# Patient Record
Sex: Female | Born: 1945
Health system: Southern US, Community
[De-identification: ages and names within clinical notes are randomized; demographics above are authoritative.]

## PROBLEM LIST (undated history)

## (undated) DIAGNOSIS — R51 Headache: Secondary | ICD-10-CM

## (undated) DIAGNOSIS — F419 Anxiety disorder, unspecified: Secondary | ICD-10-CM

## (undated) DIAGNOSIS — J302 Other seasonal allergic rhinitis: Secondary | ICD-10-CM

## (undated) DIAGNOSIS — R0989 Other specified symptoms and signs involving the circulatory and respiratory systems: Secondary | ICD-10-CM

## (undated) DIAGNOSIS — F32A Depression, unspecified: Secondary | ICD-10-CM

## (undated) DIAGNOSIS — R413 Other amnesia: Secondary | ICD-10-CM

## (undated) DIAGNOSIS — R404 Transient alteration of awareness: Secondary | ICD-10-CM

## (undated) DIAGNOSIS — F329 Major depressive disorder, single episode, unspecified: Secondary | ICD-10-CM

## (undated) HISTORY — DX: Transient alteration of awareness: R40.4

## (undated) HISTORY — DX: Major depressive disorder, single episode, unspecified: F32.9

## (undated) HISTORY — PX: TUBAL LIGATION: SHX77

## (undated) HISTORY — DX: Other amnesia: R41.3

## (undated) HISTORY — DX: Other specified symptoms and signs involving the circulatory and respiratory systems: R09.89

## (undated) HISTORY — DX: Headache: R51

## (undated) HISTORY — DX: Anxiety disorder, unspecified: F41.9

## (undated) HISTORY — DX: Depression, unspecified: F32.A

## (undated) HISTORY — DX: Other seasonal allergic rhinitis: J30.2

---

## 2004-05-14 ENCOUNTER — Other Ambulatory Visit: Admission: RE | Admit: 2004-05-14 | Discharge: 2004-05-14 | Payer: Self-pay | Admitting: Family Medicine

## 2007-12-23 ENCOUNTER — Other Ambulatory Visit: Admission: RE | Admit: 2007-12-23 | Discharge: 2007-12-23 | Payer: Self-pay | Admitting: Family Medicine

## 2012-08-05 ENCOUNTER — Encounter: Payer: Self-pay | Admitting: Neurology

## 2012-08-05 ENCOUNTER — Ambulatory Visit (INDEPENDENT_AMBULATORY_CARE_PROVIDER_SITE_OTHER): Payer: Medicare Other | Admitting: Neurology

## 2012-08-05 VITALS — BP 134/86 | HR 73 | Ht 67.0 in | Wt 204.0 lb

## 2012-08-05 DIAGNOSIS — R51 Headache: Secondary | ICD-10-CM

## 2012-08-05 DIAGNOSIS — R404 Transient alteration of awareness: Secondary | ICD-10-CM | POA: Insufficient documentation

## 2012-08-05 DIAGNOSIS — R519 Headache, unspecified: Secondary | ICD-10-CM | POA: Insufficient documentation

## 2012-08-05 MED ORDER — TIZANIDINE HCL 2 MG PO CAPS: 2.0000 mg | ORAL_CAPSULE | Freq: Three times a day (TID) | ORAL | Status: DC

## 2012-08-05 NOTE — Progress Notes (Signed)
Reason for visit: Altered awareness  Mariah Stevenson is a 67 y.o. female  History of present illness:  Mariah Stevenson is a 67 year old right-handed white female with a history of depression. The patient has been on Prozac for this. The patient was seen through this office in February 2013 for a tremor that occurred while on lithium. The patient had been on Seroquel previously. The patient indicates an approximately 18 month history of a pressure sensations throughout the head that feels as if there is a balloon inside the headache expanding. The pressure sensation is present everyday, with some days being worse than others. The patient denies any significant neck stiffness with the pressure. The patient indicates that often times the pressure sensations are worse in the morning, better as the day goes on. The patient has a sensation of stuffiness in the sinuses, but treatment with antibiotics have not helped. The patient also reports episodes that began about 6-8 months prior to this evaluation. The patient will have episodes of altered awareness. The patient feels as if she might black out or lose control of her body. The patient denies any palpitations of the heart, and she may have some tingling sensations around the mouth. The patient otherwise has not noted any numbness around the arms or legs. The patient has not actually had a blackout. The patient also has other episodes of unfamiliar feelings. The patient will be driving and suddenly does not recognize her surroundings. The episodes of the loss of control may last about 5 minutes, and the patient believes that the episodes have occurred around 20 times within the last one year. The patient indicates that the feelings of unfamiliar surroundings and the episodes of losing control are separate from one another. The patient gives a history of at least 2 seizures as a child, with the last episode occurring at 67 years of age. The patient was  treated with phenobarbital for approximately one year, and she has not been on any anticonvulsant medications since that time. The patient is sent to this office for an evaluation.  Past Medical History  Diagnosis Date  . Headache   . Seasonal allergies   . Depression   . Sinus complaint   . Altered awareness, transient     History reviewed. No pertinent past surgical history.  Family History  Problem Relation Age of Onset  . Stroke Father     Social history:  reports that she quit smoking about 19 years ago. She does not have any smokeless tobacco history on file. She reports that  drinks alcohol. She reports that she does not use illicit drugs.  Medications:  No current outpatient prescriptions on file prior to visit.   No current facility-administered medications on file prior to visit.    Allergies:  Allergies  Allergen Reactions  . Penicillins Rash    thrast in throat  . Sulfur     seizures    ROS:  Out of a complete 14 system review of symptoms, the patient complains only of the following symptoms, and all other reviewed systems are negative.  Fatigue Ringing in the ears Cough Feeling cold Memory loss, headache, numbness  Blood pressure 134/86, pulse 73, height 5\' 7"  (1.702 m), weight 204 lb (92.534 kg).  Physical Exam  General: The patient is alert and cooperative at the time of the examination.  Head: Pupils are equal, round, and reactive to light. Discs are flat bilaterally.  Neck: The neck is supple, no carotid bruits are  noted.  Respiratory: The respiratory examination is clear.  Cardiovascular: The cardiovascular examination reveals a regular rate and rhythm, no obvious murmurs or rubs are noted.  Skin: Extremities are without significant edema.  Neurologic Exam  Mental status:  Cranial nerves: Facial symmetry is present. There is good sensation of the face to pinprick and soft touch bilaterally. The strength of the facial muscles and the  muscles to head turning and shoulder shrug are normal bilaterally. Speech is well enunciated, no aphasia or dysarthria is noted. Extraocular movements are full. Visual fields are full.  Motor: The motor testing reveals 5 over 5 strength of all 4 extremities. Good symmetric motor tone is noted throughout.  Sensory: Sensory testing is intact to pinprick, soft touch, vibration sensation, and position sense on all 4 extremities. No evidence of extinction is noted.  Coordination: Cerebellar testing reveals good finger-nose-finger and heel-to-shin bilaterally.  Gait and station: Gait is normal. Tandem gait is slightly unsteady. Romberg is negative. No drift is seen.  Reflexes: Deep tendon reflexes are symmetric and normal bilaterally. Toes are downgoing bilaterally.   Assessment/Plan:  1. Muscle tension headache  2. Episodes of altered awareness   3. History of seizures as a child  The episodes of altered awareness and feeling of unfamiliar surroundings could potentially represent seizure events. The patient is having a daily pressure sensations in the head that likely represent muscle tension headache. The patient will be given a trial on tizanidine for the headaches, and she will undergo a seizure workup. The patient will be sent for MRI evaluation of the brain with and without gadolinium. The patient will have an EEG study. The patient will followup in 3 months. An empiric trial on antiepileptic medications in the future may be indicated.  Marlan Palau MD 08/05/2012 7:34 PM  [facility] [address] [place], [place] [address]  Phone [phone] Fax [phone]

## 2012-08-07 ENCOUNTER — Ambulatory Visit (INDEPENDENT_AMBULATORY_CARE_PROVIDER_SITE_OTHER): Payer: Medicare Other

## 2012-08-07 ENCOUNTER — Telehealth: Payer: Self-pay | Admitting: Neurology

## 2012-08-07 DIAGNOSIS — R404 Transient alteration of awareness: Secondary | ICD-10-CM

## 2012-08-07 DIAGNOSIS — R51 Headache: Secondary | ICD-10-CM

## 2012-08-07 NOTE — Telephone Encounter (Signed)
I called the patient. The EEG study is unremarkable. I'll call her mind at the results of the MRI the brain. If the episodes continue, an empiric trial on seizure medications may still be indicated.

## 2012-08-07 NOTE — Procedures (Signed)
  History:  Mariah Stevenson is a 67 year old patient with a history of episodes of feeling as if she is losing control of her body. The patient may have some numbness and tingling sensations around the mouth. The patient is being evaluated for these episodes.   This is a routine EEG. No skull defects are noted. Medications include calcium, iron supplementation, Prozac, multivitamins, Prilosec, and tizanidine.   EEG classification: Essentially normal awake  Description of the recording: The background rhythms of this recording consists of a fairly well modulated low amplitude alpha rhythm of 10 Hz that is reactive to eye opening and closure. As the record progresses, the patient appears to remain in the waking state throughout the recording. Photic stimulation was performed, resulting in a bilateral and symmetric photic driving response. Hyperventilation was also performed, resulting in a minimal buildup of the background rhythm activities without significant slowing seen. At no time during the recording does there appear to be evidence of spike or spike wave discharges or evidence of focal slowing. EKG monitor shows no evidence of cardiac rhythm abnormalities with a heart rate of 66.  Impression: This is an essentially normal EEG recording in the waking state. No evidence of ictal or interictal discharges are seen.

## 2012-08-14 ENCOUNTER — Ambulatory Visit
Admission: RE | Admit: 2012-08-14 | Discharge: 2012-08-14 | Disposition: A | Discharge status: Home / Self Care | Payer: Medicare Other | Source: Ambulatory Visit | Attending: Neurology | Admitting: Neurology

## 2012-08-14 DIAGNOSIS — R51 Headache: Secondary | ICD-10-CM

## 2012-08-14 DIAGNOSIS — R404 Transient alteration of awareness: Secondary | ICD-10-CM

## 2012-08-14 MED ORDER — GADOBENATE DIMEGLUMINE 529 MG/ML IV SOLN
19.0000 mL | Freq: Once | INTRAVENOUS | Status: AC | PRN
  Administered 2012-08-14: 19 mL via INTRAVENOUS

## 2012-08-17 ENCOUNTER — Telehealth: Payer: Self-pay | Admitting: Neurology

## 2012-08-17 NOTE — Telephone Encounter (Signed)
I called the patient. The MRI of the brain looked OK, some mild SVD. The patient will call if the episodes of feeling of unfamiliarity worsens, and we will try topamax.

## 2012-10-13 ENCOUNTER — Other Ambulatory Visit: Payer: Self-pay

## 2012-10-13 MED ORDER — TIZANIDINE HCL 2 MG PO CAPS: 2.0000 mg | ORAL_CAPSULE | Freq: Three times a day (TID) | ORAL | Status: DC

## 2012-12-10 ENCOUNTER — Ambulatory Visit (INDEPENDENT_AMBULATORY_CARE_PROVIDER_SITE_OTHER): Payer: Medicare Other | Admitting: Neurology

## 2012-12-10 ENCOUNTER — Encounter: Payer: Self-pay | Admitting: Neurology

## 2012-12-10 VITALS — BP 127/71 | HR 66 | Ht 67.5 in | Wt 203.0 lb

## 2012-12-10 DIAGNOSIS — R51 Headache: Secondary | ICD-10-CM

## 2012-12-10 DIAGNOSIS — R404 Transient alteration of awareness: Secondary | ICD-10-CM

## 2012-12-10 DIAGNOSIS — D518 Other vitamin B12 deficiency anemias: Secondary | ICD-10-CM

## 2012-12-10 DIAGNOSIS — R413 Other amnesia: Secondary | ICD-10-CM

## 2012-12-10 DIAGNOSIS — R6889 Other general symptoms and signs: Secondary | ICD-10-CM

## 2012-12-10 HISTORY — DX: Other amnesia: R41.3

## 2012-12-10 MED ORDER — TIZANIDINE HCL 2 MG PO CAPS: ORAL_CAPSULE | ORAL | Status: DC

## 2012-12-10 NOTE — Progress Notes (Signed)
Reason for visit: Tension headache  Mariah Stevenson is an 67 y.o. female  History of present illness:  Mariah Stevenson is a 67 year old right-handed white female with a history of depression and tension headaches. The patient indicates that the tizanidine has helped some, but she never went up to 3 of the 2 mg tablets daily. The patient takes one tablet twice daily. The patient indicates that she is having issues with fatigue. The patient claims that she likes to sleep on her side, but she has some hip arthritis, and she now has to sleep flat on her back. The patient does not know whether she snores or not at night, but she feels fatigued as if she never slept when she gets up in the morning. The patient has not had any further events of confusion or loss of awareness. The EEG study was normal. MRI evaluation of the brain shows mild small vessel disease. The patient reports that she has some issues with her memory that she believes may be lifelong where some events in the distant past that occurred with herself or her children that she does not recall. The patient returns to this office for an evaluation.  Past Medical History  Diagnosis Date  . Headache(784.0)   . Seasonal allergies   . Depression   . Sinus complaint   . Altered awareness, transient   . Memory deficits 12/10/2012    Past Surgical History  Procedure Laterality Date  . Tubal ligation      Family History  Problem Relation Age of Onset  . Stroke Father     Social history:  reports that she quit smoking about 19 years ago. She has never used smokeless tobacco. She reports that  drinks alcohol. She reports that she does not use illicit drugs.    Allergies  Allergen Reactions  . Sulfur Other (See Comments)    seizures  . Penicillins Rash    thrush in throat    Medications:  Current Outpatient Prescriptions on File Prior to Visit  Medication Sig Dispense Refill  . Calcium Carbonate-Vitamin D (CALCIUM + D PO)  Take by mouth. One tablet daily      . ferrous sulfate 325 (65 FE) MG tablet Take 325 mg by mouth daily with breakfast.      . FLUoxetine HCl (PROZAC PO) Take 60 mg by mouth. Three tablets daily      . Multiple Vitamin (MULTIVITAMIN) capsule Take 1 capsule by mouth daily.      Marland Kitchen omeprazole (PRILOSEC) 20 MG capsule Take 20 mg by mouth daily.       No current facility-administered medications on file prior to visit.    ROS:  Out of a complete 14 system review of symptoms, the patient complains only of the following symptoms, and all other reviewed systems are negative.  Ringing in the ears Joint pain Achy muscles Memory, headache, numbness, confusion, difficulty swallowing Depression, anxiety, decreased energy, and disinterest in activities, change in appetite Sleepiness  Blood pressure 127/71, pulse 66, height 5' 7.5" (1.715 m), weight 203 lb (92.08 kg).  Physical Exam  General: The patient is alert and cooperative at the time of the examination. The patient is minimally obese.  Skin: No significant peripheral edema is noted.   Neurologic Exam  Cranial nerves: Facial symmetry is present. Speech is normal, no aphasia or dysarthria is noted. Extraocular movements are full. Visual fields are full.  Motor: The patient has good strength in all 4 extremities.  Coordination:  The patient has good finger-nose-finger and heel-to-shin bilaterally.  Gait and station: The patient has a normal gait. Tandem gait is normal. Romberg is negative. No drift is seen.  Reflexes: Deep tendon reflexes are symmetric.   Assessment/Plan:  1. Tension headache  2. Episodes of altered awareness  3. Reported memory disorder  The patient continues to have her tension headaches. The patient indicates that she is having increased fatigue, and she has been forced recently to sleep on her back secondary to hip discomfort. The patient could be developing obstructive sleep apnea because of this, and this  may be increasing her fatigue, and her problems with decreased concentration. We will check blood work today. If the fatigue issues continuing to worsen, a sleep study may be indicated. The patient indicates that she is not having episodes of loss of awareness at this point. If this recurs, impaired therapy with Topamax may be indicated. The patient will followup in 6 months.  Marlan Palau MD 12/10/2012 5:28 PM  [facility] [address] [place], [place] [address]  Phone [phone] Fax [phone]

## 2012-12-11 LAB — TSH: TSH: 1.84 u[IU]/mL (ref 0.450–4.500)

## 2012-12-11 LAB — RPR: RPR: NONREACTIVE

## 2012-12-11 LAB — VITAMIN B12: Vitamin B-12: 1283 pg/mL — ABNORMAL HIGH (ref 211–946)

## 2012-12-11 NOTE — Progress Notes (Signed)
Quick Note:  Spoke to patient and relayed blood work results as unremarkable, per Dr. Willis. ______ 

## 2013-04-30 ENCOUNTER — Encounter: Payer: Self-pay | Admitting: Neurology

## 2013-06-21 ENCOUNTER — Ambulatory Visit (INDEPENDENT_AMBULATORY_CARE_PROVIDER_SITE_OTHER): Payer: Medicare Other | Admitting: Neurology

## 2013-06-21 ENCOUNTER — Encounter: Payer: Self-pay | Admitting: Neurology

## 2013-06-21 ENCOUNTER — Encounter (INDEPENDENT_AMBULATORY_CARE_PROVIDER_SITE_OTHER): Payer: Self-pay

## 2013-06-21 VITALS — Wt 202.0 lb

## 2013-06-21 DIAGNOSIS — R413 Other amnesia: Secondary | ICD-10-CM

## 2013-06-21 DIAGNOSIS — R51 Headache: Secondary | ICD-10-CM

## 2013-06-21 NOTE — Progress Notes (Signed)
Reason for visit: Headache  Mariah Stevenson is an 68 y.o. female  History of present illness:  Mariah Stevenson is a 68 year old right-handed white female with a history of headaches that were felt secondary to tension headaches. The patient has been placed on tizanidine, but she did not note any particular benefit with the medication. The patient has had an improvement with the headaches, possibly following an increase in the dose of the Prozac to 60 mg daily. The patient is having 2 or 3 headaches a week, and [facility]. The patient is able to function through the headache, and the headaches are not disabling. More recently, the patient has noted some issues with intermittent numbness of the hands, usually when she is sitting and putting pressure on the hands. The patient does not note any problems with waking up at night with the numbness. The patient indicates that she has not had any further episodes of confusion. The patient in general feels that she is doing much better. The patient has come off of the tizanidine.  Past Medical History  Diagnosis Date  . Headache(784.0)   . Seasonal allergies   . Depression   . Sinus complaint   . Altered awareness, transient   . Memory deficits 12/10/2012    Past Surgical History  Procedure Laterality Date  . Tubal ligation      Family History  Problem Relation Age of Onset  . Stroke Father     Social history:  reports that she quit smoking about 20 years ago. She has never used smokeless tobacco. She reports that she drinks alcohol. She reports that she does not use illicit drugs.    Allergies  Allergen Reactions  . Sulfur Other (See Comments)    seizures  . Penicillins Rash    thrush in throat    Medications:  Current Outpatient Prescriptions on File Prior to Visit  Medication Sig Dispense Refill  . Biotin (PA BIOTIN) 1000 MCG tablet Take 1,000 mcg by mouth daily.      . Calcium  Carbonate-Vitamin D (CALCIUM + D PO) Take by mouth. One tablet daily      . Multiple Vitamin (MULTIVITAMIN) capsule Take 1 capsule by mouth daily.      Marland Kitchen. omeprazole (PRILOSEC) 20 MG capsule Take 20 mg by mouth daily.      . montelukast (SINGULAIR) 10 MG tablet Take 10 mg by mouth daily.       No current facility-administered medications on file prior to visit.    ROS:  Out of a complete 14 system review of symptoms, the patient complains only of the following symptoms, and all other reviewed systems are negative.  Ring in the ears, difficulty swallowing Cold intolerance Frequent waking, daytime sleepiness Joint pain Memory loss, numbness, depression  Weight 202 lb (91.627 kg).  Physical Exam  General: The patient is alert and cooperative at the time of the examination.  Neuromuscular: The patient has good range of movement the cervical spine.  Skin: No significant peripheral edema is noted.   Neurologic Exam  Mental status: The patient is oriented x 3.Mini-Mental status examination done today shows a total score of 30/30.  Cranial nerves: Facial symmetry is present. Speech is normal, no aphasia or dysarthria is noted. Extraocular movements are full. Visual fields are full.  Motor: The patient has good strength in all 4 extremities.The patient has good strength of the intrinsic muscles of the hands, Tinel sign is negative  at the wrists bilaterally.  Sensory examination:Soft touch sensation is symmetric on the face, arms, and legs.  Coordination: The patient has good finger-nose-finger and heel-to-shin bilaterally.  Gait and station: The patient has a normal gait. Tandem gait is slightly unsteady. Romberg is negative. No drift is seen.  Reflexes: Deep tendon reflexes are symmetric, but the ankle jerk reflexes are depressed bilaterally.   Assessment/Plan:  One. History of headache  2. Episodes of confusion  The patient has had resolution of the episodes of confusion.  The patient is doing better with her headaches, feeling better in general. The patient is taking BC powders if needed for headache. I have cautioned her about overusing this medication. The patient will contact our office if further problems arise, otherwise the patient will be seen as needed.  Marlan Palau MD 06/21/2013 7:57 PM  [facility] [address] [place], [place] [address]  Phone [phone] Fax [phone]

## 2013-06-21 NOTE — Patient Instructions (Signed)

## 2014-01-07 ENCOUNTER — Encounter: Payer: Self-pay | Admitting: *Deleted

## 2014-02-16 ENCOUNTER — Encounter: Payer: Self-pay | Admitting: Neurology

## 2014-02-22 ENCOUNTER — Encounter: Payer: Self-pay | Admitting: Neurology

## 2014-11-28 ENCOUNTER — Other Ambulatory Visit: Payer: Self-pay | Admitting: Pain Medicine

## 2014-11-28 DIAGNOSIS — R4182 Altered mental status, unspecified: Secondary | ICD-10-CM

## 2014-12-06 ENCOUNTER — Ambulatory Visit (INDEPENDENT_AMBULATORY_CARE_PROVIDER_SITE_OTHER): Payer: Medicare Other

## 2014-12-06 DIAGNOSIS — R4182 Altered mental status, unspecified: Secondary | ICD-10-CM | POA: Diagnosis not present

## 2014-12-07 ENCOUNTER — Ambulatory Visit: Payer: Self-pay | Admitting: Neurology

## 2014-12-09 ENCOUNTER — Other Ambulatory Visit: Payer: Self-pay | Admitting: Neurology

## 2014-12-09 DIAGNOSIS — R4182 Altered mental status, unspecified: Secondary | ICD-10-CM

## 2014-12-12 ENCOUNTER — Ambulatory Visit (INDEPENDENT_AMBULATORY_CARE_PROVIDER_SITE_OTHER): Payer: Medicare Other | Admitting: Neurology

## 2014-12-12 DIAGNOSIS — R4182 Altered mental status, unspecified: Secondary | ICD-10-CM | POA: Diagnosis not present

## 2014-12-14 NOTE — Procedures (Signed)
ELECTROENCEPHALOGRAM REPORT  Date of Study: 12/12/2014  Patient's Name: Mariah Stevenson MRN: [id number] Date of Birth: Dec 21, 1945  Referring Provider: Dr. Emmaline Life  Clinical History: This is a 69 year old woman with history of 1 seizure as an infant and another at age 63 who two years ago had "constant fogginess" which resolved and now has started again, is worse and with arm shaking. Having EEG to evaluate for seizures.  Medications: Prozac, Prilosec  Technical Summary: A multichannel digital EEG recording measured by the international 10-20 system with electrodes applied with paste and impedances below 5000 ohms performed in our laboratory with EKG monitoring in an awake and asleep patient.  Hyperventilation and photic stimulation were performed.  The digital EEG was referentially recorded, reformatted, and digitally filtered in a variety of bipolar and referential montages for optimal display.    Description: The patient is awake and asleep during the recording.  During maximal wakefulness, there is a poorly sustained low to medium voltage 9 Hz posterior dominant rhythm that poorly attenuates to eye opening and eye closure.  The record is symmetric.  During drowsiness and sleep, there is an increase in theta slowing of the background.  Vertex waves and symmetric sleep spindles were seen.  Hyperventilation and photic stimulation did not elicit any abnormalities.  There were no epileptiform discharges or electrographic seizures seen.    EKG lead was unremarkable.  Impression: This awake and asleep EEG is within normal limits.  Clinical Correlation: A normal EEG does not exclude a clinical diagnosis of epilepsy. Clinical correlation is advised.   Patrcia Dolly, M.D.

## 2015-05-05 DIAGNOSIS — R4182 Altered mental status, unspecified: Secondary | ICD-10-CM | POA: Diagnosis not present

## 2015-05-05 DIAGNOSIS — I6782 Cerebral ischemia: Secondary | ICD-10-CM | POA: Diagnosis not present

## 2015-06-02 DIAGNOSIS — J988 Other specified respiratory disorders: Secondary | ICD-10-CM | POA: Diagnosis not present

## 2015-06-02 DIAGNOSIS — J45909 Unspecified asthma, uncomplicated: Secondary | ICD-10-CM | POA: Diagnosis not present

## 2015-06-12 DIAGNOSIS — J209 Acute bronchitis, unspecified: Secondary | ICD-10-CM | POA: Diagnosis not present

## 2015-06-12 DIAGNOSIS — D473 Essential (hemorrhagic) thrombocythemia: Secondary | ICD-10-CM | POA: Diagnosis not present

## 2015-07-06 DIAGNOSIS — J329 Chronic sinusitis, unspecified: Secondary | ICD-10-CM | POA: Diagnosis not present

## 2015-07-17 DIAGNOSIS — J329 Chronic sinusitis, unspecified: Secondary | ICD-10-CM | POA: Diagnosis not present

## 2015-07-17 DIAGNOSIS — J9801 Acute bronchospasm: Secondary | ICD-10-CM | POA: Diagnosis not present

## 2015-07-17 DIAGNOSIS — R05 Cough: Secondary | ICD-10-CM | POA: Diagnosis not present

## 2015-07-19 ENCOUNTER — Other Ambulatory Visit: Payer: Self-pay | Admitting: Family Medicine

## 2015-07-19 DIAGNOSIS — R911 Solitary pulmonary nodule: Secondary | ICD-10-CM

## 2015-07-25 ENCOUNTER — Ambulatory Visit (INDEPENDENT_AMBULATORY_CARE_PROVIDER_SITE_OTHER): Payer: Medicare Other

## 2015-07-25 DIAGNOSIS — I2510 Atherosclerotic heart disease of native coronary artery without angina pectoris: Secondary | ICD-10-CM | POA: Diagnosis not present

## 2015-07-25 DIAGNOSIS — I7090 Unspecified atherosclerosis: Secondary | ICD-10-CM

## 2015-07-25 DIAGNOSIS — R918 Other nonspecific abnormal finding of lung field: Secondary | ICD-10-CM

## 2015-07-25 DIAGNOSIS — R911 Solitary pulmonary nodule: Secondary | ICD-10-CM

## 2015-07-28 DIAGNOSIS — R0789 Other chest pain: Secondary | ICD-10-CM | POA: Diagnosis not present

## 2015-07-28 DIAGNOSIS — R05 Cough: Secondary | ICD-10-CM | POA: Diagnosis not present

## 2015-07-28 DIAGNOSIS — R911 Solitary pulmonary nodule: Secondary | ICD-10-CM | POA: Diagnosis not present

## 2015-08-16 DIAGNOSIS — L0390 Cellulitis, unspecified: Secondary | ICD-10-CM | POA: Diagnosis not present

## 2015-08-16 DIAGNOSIS — T148 Other injury of unspecified body region: Secondary | ICD-10-CM | POA: Diagnosis not present

## 2015-08-16 DIAGNOSIS — W108XXA Fall (on) (from) other stairs and steps, initial encounter: Secondary | ICD-10-CM | POA: Diagnosis not present

## 2015-09-15 DIAGNOSIS — H25813 Combined forms of age-related cataract, bilateral: Secondary | ICD-10-CM | POA: Diagnosis not present

## 2015-09-15 DIAGNOSIS — H52223 Regular astigmatism, bilateral: Secondary | ICD-10-CM | POA: Diagnosis not present

## 2015-09-15 DIAGNOSIS — H524 Presbyopia: Secondary | ICD-10-CM | POA: Diagnosis not present

## 2015-09-15 DIAGNOSIS — H40053 Ocular hypertension, bilateral: Secondary | ICD-10-CM | POA: Diagnosis not present

## 2015-09-15 DIAGNOSIS — H5203 Hypermetropia, bilateral: Secondary | ICD-10-CM | POA: Diagnosis not present

## 2015-09-22 DIAGNOSIS — R05 Cough: Secondary | ICD-10-CM | POA: Diagnosis not present

## 2015-09-22 DIAGNOSIS — R938 Abnormal findings on diagnostic imaging of other specified body structures: Secondary | ICD-10-CM | POA: Diagnosis not present

## 2015-09-25 DIAGNOSIS — Z1231 Encounter for screening mammogram for malignant neoplasm of breast: Secondary | ICD-10-CM | POA: Diagnosis not present

## 2015-10-05 ENCOUNTER — Ambulatory Visit (INDEPENDENT_AMBULATORY_CARE_PROVIDER_SITE_OTHER)
Admission: RE | Admit: 2015-10-05 | Discharge: 2015-10-05 | Disposition: A | Discharge status: Home / Self Care | Payer: Medicare Other | Source: Ambulatory Visit | Attending: Internal Medicine | Admitting: Internal Medicine

## 2015-10-05 ENCOUNTER — Ambulatory Visit (INDEPENDENT_AMBULATORY_CARE_PROVIDER_SITE_OTHER): Payer: Medicare Other | Admitting: Internal Medicine

## 2015-10-05 ENCOUNTER — Other Ambulatory Visit (INDEPENDENT_AMBULATORY_CARE_PROVIDER_SITE_OTHER): Payer: Medicare Other

## 2015-10-05 ENCOUNTER — Encounter: Payer: Self-pay | Admitting: Internal Medicine

## 2015-10-05 VITALS — BP 126/80 | HR 74 | Ht 66.0 in | Wt 190.6 lb

## 2015-10-05 DIAGNOSIS — R05 Cough: Secondary | ICD-10-CM | POA: Diagnosis not present

## 2015-10-05 DIAGNOSIS — R0600 Dyspnea, unspecified: Secondary | ICD-10-CM

## 2015-10-05 DIAGNOSIS — J45991 Cough variant asthma: Secondary | ICD-10-CM

## 2015-10-05 DIAGNOSIS — R918 Other nonspecific abnormal finding of lung field: Secondary | ICD-10-CM

## 2015-10-05 LAB — CBC WITH DIFFERENTIAL/PLATELET
BASOS PCT: 0.4 % (ref 0.0–3.0)
Basophils Absolute: 0 10*3/uL (ref 0.0–0.1)
EOS ABS: 0.1 10*3/uL (ref 0.0–0.7)
Eosinophils Relative: 0.7 % (ref 0.0–5.0)
HCT: 34.3 % — ABNORMAL LOW (ref 36.0–46.0)
HEMOGLOBIN: 11.6 g/dL — AB (ref 12.0–15.0)
LYMPHS ABS: 2.1 10*3/uL (ref 0.7–4.0)
Lymphocytes Relative: 21.2 % (ref 12.0–46.0)
MCHC: 33.8 g/dL (ref 30.0–36.0)
MCV: 86.3 fl (ref 78.0–100.0)
MONO ABS: 0.6 10*3/uL (ref 0.1–1.0)
Monocytes Relative: 6.4 % (ref 3.0–12.0)
NEUTROS PCT: 71.3 % (ref 43.0–77.0)
Neutro Abs: 6.9 10*3/uL (ref 1.4–7.7)
PLATELETS: 328 10*3/uL (ref 150.0–400.0)
RBC: 3.97 Mil/uL (ref 3.87–5.11)
RDW: 14.6 % (ref 11.5–15.5)
WBC: 9.7 10*3/uL (ref 4.0–10.5)

## 2015-10-05 LAB — BASIC METABOLIC PANEL
BUN: 10 mg/dL (ref 6–23)
CALCIUM: 9.5 mg/dL (ref 8.4–10.5)
CO2: 27 mEq/L (ref 19–32)
Chloride: 99 mEq/L (ref 96–112)
Creatinine, Ser: 0.7 mg/dL (ref 0.40–1.20)
GFR: 87.85 mL/min (ref >60.00)
Glucose, Bld: 100 mg/dL — ABNORMAL HIGH (ref 70–99)
POTASSIUM: 4 meq/L (ref 3.5–5.1)
SODIUM: 135 meq/L (ref 135–145)

## 2015-10-05 LAB — TSH: TSH: 1.68 u[IU]/mL (ref 0.35–4.50)

## 2015-10-05 LAB — SEDIMENTATION RATE: Sed Rate: 45 mm/hr — ABNORMAL HIGH (ref 0–30)

## 2015-10-05 LAB — BRAIN NATRIURETIC PEPTIDE: PRO B NATRI PEPTIDE: 91 pg/mL (ref 0.0–100.0)

## 2015-10-05 MED ORDER — BUDESONIDE-FORMOTEROL FUMARATE 80-4.5 MCG/ACT IN AERO: INHALATION_SPRAY | RESPIRATORY_TRACT | Status: DC

## 2015-10-05 NOTE — Patient Instructions (Addendum)
Try symbicort 80 Take 2 puffs first thing in am and then another 2 puffs about 12 hours later.   Only use your albuterol as a rescue medication to be used if you can't catch your breath by resting or doing a relaxed purse lip breathing pattern.  - The less you use it, the better it will work when you need it. - Ok to use up to 2 puffs  every 4 hours if you must but call for immediate appointment if use goes up over your usual need - Don't leave home without it !!  (think of it like the spare tire for your car)   Work on inhaler technique:  relax and gently blow all the way out then take a nice smooth deep breath back in, triggering the inhaler at same time you start breathing in.  Hold for up to 5 seconds if you can. Blow out thru nose. Rinse and gargle with water when done  Change prilosec to Take 30-60 min before first meal of the day   GERD (REFLUX)  is an extremely common cause of respiratory symptoms just like yours , many times with no obvious heartburn at all.    It can be treated with medication, but also with lifestyle changes including elevation of the head of your bed (ideally with 6 inch  bed blocks),  Smoking cessation, avoidance of late meals, excessive alcohol, and avoid fatty foods, chocolate, peppermint, colas, red wine, and acidic juices such as orange juice.  NO MINT OR MENTHOL PRODUCTS SO NO COUGH DROPS  USE SUGARLESS CANDY INSTEAD (Jolley ranchers or Stover's or Life Savers) or even ice chips will also do - the key is to swallow to prevent all throat clearing. NO OIL BASED VITAMINS - use powdered substitutes.      Please remember to go to the lab and x-ray department downstairs for your tests - we will call you with the results when they are available.   Please schedule a follow up office visit in 2 weeks, sooner if needed

## 2015-10-05 NOTE — Assessment & Plan Note (Addendum)
10/05/2015  After extensive coaching HFA effectiveness =  75%   > try symb 80 2bid  - Spirometry 10/05/2015  FEV1 2.00 (81%)  Ratio 88 p am symb 160   So this is not copd and cough better p prednisone suggests an eosinophilic syndrome either asthma or eos bronchitis but should do fine either way on lower dose ICS since cough nor wheeze a major concern  She also is on rx for gerd but not taking ppi consistently ac daily > advised might contribute to asthma or uacs> diet also reviewed   Total time devoted to counseling  = 35/1664m review case with pt/ discussion of options/alternatives/ personally creating written instructions  in presence of pt  then going over those specific  Instructions directly with the pt including how to use all of the meds but in particular covering each new medication in detail and the difference between the maintenance/automatic meds and the prns using an action plan format for the latter.

## 2015-10-05 NOTE — Progress Notes (Signed)
Subjective:     Patient ID: Mariah Stevenson, female   DOB: 1945-08-10,     MRN: [id number]  HPI   [age] yowf quit smoking in 1995 with a tendency then to recurrent  bronchitis which resolved completely and did fine until winter of 2016 and since then recurrent pattern of episodic cough esp  since winter 2017 but  even between bouts did ok until off maint rx until late spring/early summer 2017    then   needed maint for the first time = symbicort 160 2bid and referred to pulmonary clinic 10/05/2015 by Dr Izola PriceMyers for f/u of abn CT chest from 07/25/15 with a nl baseline cxr at that point   10/05/2015 1st [facility] office visit/ Annalyce Lanpher   Chief Complaint  Patient presents with  . Pulmonary Consult    Referred by Dr Carlyon ShadowSteven Myers for eval of cough and pulmonary nodules. She c/o cough off and on since Jan 2017. Her cough has been better here recently, and is occ prod with clear sputum.    2 rounds of prednisone this year and did feel better and last used it mid May 2017  New doe = MMRC1 = can walk nl pace, flat grade, can't hurry or go uphills or steps s sob   Mucus clear/ cough since last round of prednisone but throat feels dry/ tight all the time but not while sleeping   No obvious day to day or daytime variability or assoc   cp or   subjective wheeze or overt sinus or hb symptoms. No unusual exp hx or h/o childhood pna/ asthma or knowledge of premature birth.  Sleeping ok without nocturnal  or early am exacerbation  of respiratory  c/o's or need for noct saba. Also denies any obvious fluctuation of symptoms with weather or environmental changes or other aggravating or alleviating factors except as outlined above   Current Medications, Allergies, Complete Past Medical History, Past Surgical History, Family History, and Social History were reviewed in [facility] electronic medical record.  ROS  The following are not active complaints unless bolded sore throat, dysphagia, dental problems,  itching, sneezing,  nasal congestion or excess/ purulent secretions, ear ache,   fever, chills, sweats, unintended wt loss, classically pleuritic or exertional cp, hemoptysis,  orthopnea pnd or leg swelling, presyncope, palpitations, abdominal pain, anorexia, nausea, vomiting, diarrhea  or change in bowel or bladder habits, change in stools or urine, dysuria,hematuria,  rash, arthralgias, visual complaints, headache, numbness, weakness or ataxia or problems with walking or coordination,  change in mood/affect or memory.        Review of Systems     Objective:   Physical Exam     slt hoarse amb wf   Wt Readings from Last 3 Encounters:  10/05/15 190 lb 9.6 oz (86.456 kg)  12/06/14 202 lb (91.627 kg)  06/21/13 202 lb (91.627 kg)    Vital signs reviewed    HEENT: nl dentition, turbinates, and oropharynx. Nl external ear canals without cough reflex   NECK :  without JVD/Nodes/TM/ nl carotid upstrokes bilaterally   LUNGS: no acc muscle use,  Nl contour chest which is clear to A and P bilaterally without cough on insp or exp maneuvers   CV:  RRR  no s3 or murmur or increase in P2, no edema   ABD:  soft and nontender with nl inspiratory excursion in the supine position. No bruits or organomegaly, bowel sounds nl  MS:  Nl gait/ ext warm without  deformities, calf tenderness, cyanosis or clubbing No obvious joint restrictions   SKIN: warm and dry without lesions    NEURO:  alert, approp, nl sensorium with  no motor deficits       I personally reviewed images and agree with radiology impression as follows:  CT Chest   07/25/15 Multiple bilateral sub solid ground-glass nodules, largest in the left upper lobe. Considerations include infectious/inflammatory etiologies and adenocarcinoma.      CXR PA and Lateral:   10/05/2015 :    I personally reviewed images and agree with radiology impression as follows:   Nodules in the left lung which are new or more conspicuous than on the  previous study. Given the findings on the previous chest CT scan and the fact that it has been 2 1/2 months since the previous study 07/17/15    Labs ordered/ reviewed:      Chemistry      Component Value Date/Time   NA 135 10/05/2015 1423   K 4.0 10/05/2015 1423   CL 99 10/05/2015 1423   CO2 27 10/05/2015 1423   BUN 10 10/05/2015 1423   CREATININE 0.70 10/05/2015 1423      Component Value Date/Time   CALCIUM 9.5 10/05/2015 1423        Lab Results  Component Value Date   WBC 9.7 10/05/2015   HGB 11.6* 10/05/2015   HCT 34.3* 10/05/2015   MCV 86.3 10/05/2015   PLT 328.0 10/05/2015       Lab Results  Component Value Date   TSH 1.68 10/05/2015     Lab Results  Component Value Date   PROBNP 91.0 10/05/2015       Lab Results  Component Value Date   ESRSEDRATE 45* 10/05/2015          Assessment:

## 2015-10-05 NOTE — Assessment & Plan Note (Signed)
CT chest 07/25/15 c/w bilateral GG changes s appearance on cxr - ESR 10/05/2015  = 45 with conspicuous nodules on cxr   ddx includes inflammatory (BOOP) and infection (MAI) and met dz at this point so needs f/u CT then likely PET and consideration for bx vs possible short term higher dose steroid rechallenge depending on the results of the above.

## 2015-10-05 NOTE — Assessment & Plan Note (Signed)
10/05/2015  Walked RA x 3 laps @ 185 ft each stopped due to  End of study, fast  pace, no sob desat to 87% at end of study.  Clearly has evidence of significant ex desat probably from underlying ILD  s evidence of copd by pfts > for now no change in rx but f/u in 2 weeks with HRCT unless d dimer is Pos in which case CTa chest will be needed

## 2015-10-06 ENCOUNTER — Other Ambulatory Visit: Payer: Self-pay | Admitting: Internal Medicine

## 2015-10-06 DIAGNOSIS — H25812 Combined forms of age-related cataract, left eye: Secondary | ICD-10-CM | POA: Diagnosis not present

## 2015-10-06 DIAGNOSIS — R911 Solitary pulmonary nodule: Secondary | ICD-10-CM

## 2015-10-06 LAB — RESPIRATORY ALLERGY PROFILE REGION II ~~LOC~~
Allergen, D pternoyssinus,d7: <0.1 kU/L
Allergen, Mulberry, t76: <0.1 kU/L
Allergen, Oak,t7: <0.1 kU/L
Alternaria Alternata: <0.1 kU/L
Box Elder IgE: <0.1 kU/L
Cat Dander: <0.1 kU/L
Cockroach: <0.1 kU/L
Common Ragweed: <0.1 kU/L
D. farinae: <0.1 kU/L
IGE (IMMUNOGLOBULIN E), SERUM: 84 kU/L (ref <115)
Johnson Grass: <0.1 kU/L
Penicillium Notatum: <0.1 kU/L
Rough Pigweed  IgE: <0.1 kU/L
Sheep Sorrel IgE: <0.1 kU/L

## 2015-10-06 LAB — D-DIMER, QUANTITATIVE (NOT AT ARMC): D DIMER QUANT: 0.56 ug{FEU}/mL — AB (ref <0.50)

## 2015-10-09 ENCOUNTER — Ambulatory Visit (INDEPENDENT_AMBULATORY_CARE_PROVIDER_SITE_OTHER)
Admission: RE | Admit: 2015-10-09 | Discharge: 2015-10-09 | Disposition: A | Discharge status: Home / Self Care | Payer: Medicare Other | Source: Ambulatory Visit | Attending: Internal Medicine | Admitting: Internal Medicine

## 2015-10-09 DIAGNOSIS — R911 Solitary pulmonary nodule: Secondary | ICD-10-CM

## 2015-10-09 DIAGNOSIS — R0602 Shortness of breath: Secondary | ICD-10-CM | POA: Diagnosis not present

## 2015-10-09 DIAGNOSIS — R918 Other nonspecific abnormal finding of lung field: Secondary | ICD-10-CM

## 2015-10-10 ENCOUNTER — Telehealth: Payer: Self-pay | Admitting: Internal Medicine

## 2015-10-10 LAB — HYPERSENSITIVITY PNUEMONITIS PROFILE

## 2015-10-10 NOTE — Progress Notes (Signed)
Quick Note:  lmtcb ______ 

## 2015-10-10 NOTE — Telephone Encounter (Signed)
Per CT chest: Result Note     Call patient : Study is c/w a benign inflammatory condition that may require more steroids but prefer to wait until f/u ov to discuss in more detail re risk/ benefit and that depends in part in how she responds to rx we rec at last ov  --  ATC received fast busy signal. [facility]

## 2015-10-11 ENCOUNTER — Telehealth: Payer: Self-pay | Admitting: Internal Medicine

## 2015-10-11 NOTE — Telephone Encounter (Signed)
Called and spoke with pt and she is aware of lab results.  

## 2015-10-11 NOTE — Telephone Encounter (Signed)
Pt is aware of results. Has pending OV with MW on 10/24/2015. Nothing further was needed.

## 2015-10-11 NOTE — Progress Notes (Signed)
Quick Note:  LMTCB ______ 

## 2015-10-12 NOTE — Progress Notes (Signed)
Quick Note:  Spoke with pt and notified of results per Dr. Wert. Pt verbalized understanding and denied any questions.  ______ 

## 2015-10-20 ENCOUNTER — Other Ambulatory Visit: Payer: Self-pay | Admitting: Emergency Medicine

## 2015-10-20 NOTE — Telephone Encounter (Signed)
Was going to send a refill request to Dr. [place]. Called [facility] to see the prescribing physician, chart review doesn't show that the pt has been seen by Dr. [place].  Spoke with Colin MuldersBrianna, she informed me that the pt has anohter prescription written by another provider that has refills and doesn't know why the refill request was sent.

## 2015-10-24 ENCOUNTER — Ambulatory Visit (INDEPENDENT_AMBULATORY_CARE_PROVIDER_SITE_OTHER): Payer: Medicare Other | Admitting: Internal Medicine

## 2015-10-24 ENCOUNTER — Encounter: Payer: Self-pay | Admitting: Internal Medicine

## 2015-10-24 VITALS — BP 122/68 | HR 64 | Ht 66.0 in | Wt 193.2 lb

## 2015-10-24 DIAGNOSIS — R918 Other nonspecific abnormal finding of lung field: Secondary | ICD-10-CM

## 2015-10-24 DIAGNOSIS — J45991 Cough variant asthma: Secondary | ICD-10-CM | POA: Diagnosis not present

## 2015-10-24 MED ORDER — BUDESONIDE-FORMOTEROL FUMARATE 80-4.5 MCG/ACT IN AERO: INHALATION_SPRAY | RESPIRATORY_TRACT | Status: DC

## 2015-10-24 MED ORDER — BUDESONIDE-FORMOTEROL FUMARATE 80-4.5 MCG/ACT IN AERO: INHALATION_SPRAY | RESPIRATORY_TRACT | 11 refills | Status: DC

## 2015-10-24 MED ORDER — PREDNISONE 10 MG PO TABS: ORAL_TABLET | ORAL | 0 refills | Status: DC

## 2015-10-24 NOTE — Patient Instructions (Signed)
If worse at all :  Prednisone 10 mg take  4 each am x 2 days,   2 each am x 2 days,  1 each am x 2 days and stop   Plan A = Automatic = Symbicort 80 Take 2 puffs first thing in am and then another 2 puffs about 12 hours later.      Plan B = Backup Only use your albuterol (ventolin) as a rescue medication to be used if you can't catch your breath by resting or doing a relaxed purse lip breathing pattern.  - The less you use it, the better it will work when you need it. - Ok to use the inhaler up to 2 puffs  every 4 hours if you must but call for appointment if use goes up over your usual need - Don't leave home without it !!  (think of it like the spare tire for your car)    Please schedule a follow up office visit in 6 weeks, call sooner if needed with cxr and pfts same day

## 2015-10-24 NOTE — Assessment & Plan Note (Signed)
CT chest 07/25/15 c/w bilateral GG changes s appearance on cxr - HSP 10/05/15  Neg serologies - ESR 10/05/2015  = 45 with conspicuous nodules on cxr  - CT chest 10/09/15 >>> probable cryptogenic organizing pneumonia (COP),   Clearly better just with topical steroids so far  The goal with a chronic steroid dependent illness is always arriving at the lowest effective dose that controls the disease/symptoms and not accepting a set "formula" which is based on statistics or guidelines that don't always take into account patient  variability or the natural hx of the dz in every individual patient, which may well vary over time.  For now therefore I recommend the patient maintain off pred but I did give her a 6 day course for any flares pending return here for pfts and cxr and repeat esr  I had an extended discussion with the patient reviewing all relevant studies completed to date and  lasting 15 to 20 minutes of a 25 minute visit    Each maintenance medication was reviewed in detail including most importantly the difference between maintenance and prns and under what circumstances the prns are to be triggered using an action plan format that is not reflected in the computer generated alphabetically organized AVS.    Please see instructions for details which were reviewed in writing and the patient given a copy highlighting the part that I personally wrote and discussed at today's ov.

## 2015-10-24 NOTE — Assessment & Plan Note (Signed)
-   Spirometry 10/05/2015  FEV1 2.00 (81%)  Ratio 88 p am symb 160 > change to 80 2bid   - 10/24/2015  After extensive coaching HFA effectiveness =    75% from a baseline of 25%  I am surprised she responded to the lower dose of symb based on poor hfa which suggests she might do just great on the 80 2bid used correctly and if not can rechallenge with the 160 2bid but be on the lookout for worse cough from the upper airway irritation seen with uacs and ics  See avs

## 2015-10-24 NOTE — Progress Notes (Signed)
Subjective:     Patient ID: Mariah Stevenson, female   DOB: 12/12/1945    MRN: [id number]     Brief patient profile:  42 yowf quit smoking in 1995 with a tendency then to recurrent  bronchitis which resolved completely and did fine until winter of 2016 and since then recurrent pattern of episodic cough esp  since winter 2017 but  even between bouts did ok until off maint rx until late spring/early summer 2017    then   needed maint for the first time = symbicort 160 2bid and referred to pulmonary clinic 10/05/2015 by Dr Izola Price for f/u of abn CT chest from 07/25/15 with a nl baseline cxr at that point with evidence of BOOP on HRCT 10/09/15    History of Present Illness  10/05/2015 1st [facility] office visit/ Threasa Kinch   Chief Complaint  Patient presents with  . Pulmonary Consult    Referred by Dr Carlyon Shadow for eval of cough and pulmonary nodules. She c/o cough off and on since Jan 2017. Her cough has been better here recently, and is occ prod with clear sputum.    2 rounds of prednisone this year and did feel better and last used it mid May 2017  New doe = MMRC1 = can walk nl pace, flat grade, can't hurry or go uphills or steps s sob   Mucus clear/ cough since last round of prednisone but throat feels dry/ tight all the time but not while sleeping  rec Try symbicort 80 Take 2 puffs first thing in am and then another 2 puffs about 12 hours later.  Only use your albuterol as a rescue medication  Work on inhaler technique:   Change prilosec to Take 30-60 min before first meal of the day  GERD  Diet    10/24/2015  f/u ov/Kamran Coker re:  boop ? Cough variant asthma?  maint on symb 802bid but poor hfa Chief Complaint  Patient presents with  . Follow-up    Cough had got better, but cough returning this week,dry usually, clear occass.Denies sob or wheezing. Midchest tightness 1 episode.Only used Albuterol 1x in last week  was 100% improved then one week prior noted worse cough/tightness  with heat  humidity      No obvious day to day or daytime variability or assoc excess/ purulent sputum or mucus plugs    cp or   subjective wheeze or overt sinus or hb symptoms. No unusual exp hx or h/o childhood pna/ asthma or knowledge of premature birth.  Sleeping ok without nocturnal  or early am exacerbation  of respiratory  c/o's or need for noct saba. Also denies any obvious fluctuation of symptoms with weather or environmental changes or other aggravating or alleviating factors except as outlined above   Current Medications, Allergies, Complete Past Medical History, Past Surgical History, Family History, and Social History were reviewed in [facility] record.  ROS  The following are not active complaints unless bolded sore throat, dysphagia, dental problems, itching, sneezing,  nasal congestion or excess/ purulent secretions, ear ache,   fever, chills, sweats, unintended wt loss, classically pleuritic or exertional cp, hemoptysis,  orthopnea pnd or leg swelling, presyncope, palpitations, abdominal pain, anorexia, nausea, vomiting, diarrhea  or change in bowel or bladder habits, change in stools or urine, dysuria,hematuria,  rash, arthralgias, visual complaints, headache, numbness, weakness or ataxia or problems with walking or coordination,  change in mood/affect or memory.  Objective:   Physical Exam     slt hoarse amb wf    10/24/2015       193   10/05/15 190 lb 9.6 oz (86.456 kg)  12/06/14 202 lb (91.627 kg)  06/21/13 202 lb (91.627 kg)    Vital signs reviewed    HEENT: nl dentition, turbinates, and oropharynx. Nl external ear canals without cough reflex   NECK :  without JVD/Nodes/TM/ nl carotid upstrokes bilaterally   LUNGS: no acc muscle use,  Nl contour chest which is clear to A and P bilaterally without cough on insp or exp maneuvers   CV:  RRR  no s3 or murmur or increase in P2, no edema   ABD:  soft and nontender with nl inspiratory  excursion in the supine position. No bruits or organomegaly, bowel sounds nl  MS:  Nl gait/ ext warm without deformities, calf tenderness, cyanosis or clubbing No obvious joint restrictions   SKIN: warm and dry without lesions    NEURO:  alert, approp, nl sensorium with  no motor deficits             I personally reviewed images and agree with radiology impression as follows:  CT Chest   10/09/15  1. There is a spectrum of findings in the lungs concerning for probable cryptogenic organizing pneumonia (COP), as above.   Labs ordered/ reviewed:    Chemistry      Component Value Date/Time   NA 135 10/05/2015 1423   K 4.0 10/05/2015 1423   CL 99 10/05/2015 1423   CO2 27 10/05/2015 1423   BUN 10 10/05/2015 1423   CREATININE 0.70 10/05/2015 1423      Component Value Date/Time   CALCIUM 9.5 10/05/2015 1423        Lab Results  Component Value Date   WBC 9.7 10/05/2015   HGB 11.6* 10/05/2015   HCT 34.3* 10/05/2015   MCV 86.3 10/05/2015   PLT 328.0 10/05/2015       Eos                         0.1                                                                    10/05/15    Lab Results  Component Value Date   TSH 1.68 10/05/2015     Lab Results  Component Value Date   PROBNP 91.0 10/05/2015       Lab Results  Component Value Date   ESRSEDRATE 45* 10/05/2015            Assessment:

## 2015-11-02 DIAGNOSIS — H25812 Combined forms of age-related cataract, left eye: Secondary | ICD-10-CM | POA: Diagnosis not present

## 2015-11-02 DIAGNOSIS — H2512 Age-related nuclear cataract, left eye: Secondary | ICD-10-CM | POA: Diagnosis not present

## 2015-11-23 DIAGNOSIS — H2511 Age-related nuclear cataract, right eye: Secondary | ICD-10-CM | POA: Diagnosis not present

## 2015-11-23 DIAGNOSIS — H25811 Combined forms of age-related cataract, right eye: Secondary | ICD-10-CM | POA: Diagnosis not present

## 2015-11-23 DIAGNOSIS — H2512 Age-related nuclear cataract, left eye: Secondary | ICD-10-CM | POA: Diagnosis not present

## 2015-11-27 DIAGNOSIS — Z86010 Personal history of colonic polyps: Secondary | ICD-10-CM | POA: Diagnosis not present

## 2015-11-27 DIAGNOSIS — Z0000 Encounter for general adult medical examination without abnormal findings: Secondary | ICD-10-CM | POA: Diagnosis not present

## 2015-11-27 DIAGNOSIS — D473 Essential (hemorrhagic) thrombocythemia: Secondary | ICD-10-CM | POA: Diagnosis not present

## 2015-11-27 DIAGNOSIS — Z8639 Personal history of other endocrine, nutritional and metabolic disease: Secondary | ICD-10-CM | POA: Diagnosis not present

## 2015-11-27 DIAGNOSIS — J989 Respiratory disorder, unspecified: Secondary | ICD-10-CM | POA: Diagnosis not present

## 2015-11-27 DIAGNOSIS — Z23 Encounter for immunization: Secondary | ICD-10-CM | POA: Diagnosis not present

## 2015-11-27 DIAGNOSIS — K5909 Other constipation: Secondary | ICD-10-CM | POA: Diagnosis not present

## 2015-12-05 ENCOUNTER — Ambulatory Visit (INDEPENDENT_AMBULATORY_CARE_PROVIDER_SITE_OTHER)
Admission: RE | Admit: 2015-12-05 | Discharge: 2015-12-05 | Disposition: A | Discharge status: Home / Self Care | Payer: Medicare Other | Source: Ambulatory Visit | Attending: Internal Medicine | Admitting: Internal Medicine

## 2015-12-05 ENCOUNTER — Other Ambulatory Visit (INDEPENDENT_AMBULATORY_CARE_PROVIDER_SITE_OTHER): Payer: Medicare Other

## 2015-12-05 ENCOUNTER — Ambulatory Visit (INDEPENDENT_AMBULATORY_CARE_PROVIDER_SITE_OTHER): Payer: Medicare Other | Admitting: Internal Medicine

## 2015-12-05 ENCOUNTER — Encounter: Payer: Self-pay | Admitting: Internal Medicine

## 2015-12-05 VITALS — BP 112/76 | HR 70 | Ht 66.0 in | Wt 194.6 lb

## 2015-12-05 DIAGNOSIS — R918 Other nonspecific abnormal finding of lung field: Secondary | ICD-10-CM

## 2015-12-05 DIAGNOSIS — J45991 Cough variant asthma: Secondary | ICD-10-CM | POA: Diagnosis not present

## 2015-12-05 LAB — SEDIMENTATION RATE: SED RATE: 35 mm/h — AB (ref 0–30)

## 2015-12-05 NOTE — Progress Notes (Signed)
Subjective:     Patient ID: Mariah Stevenson, female   DOB: 06/23/1945    MRN: [id number]     Brief patient profile:  [age] yowf quit smoking in 1995 with a tendency then to recurrent  bronchitis which resolved completely and did fine until winter of 2016 and since then recurrent pattern of episodic cough esp  since winter 2017 but  even between bouts did ok until off maint rx until late spring/early summer 2017    then   needed maint for the first time = symbicort 160 2bid and referred to pulmonary clinic 10/05/2015 by Mariah Stevenson for f/u of abn CT chest from 07/25/15 with a nl baseline cxr at that point with evidence of BOOP on HRCT 10/09/15    History of Present Illness  10/05/2015 1st [facility] office visit/ Dayson Stevenson   Chief Complaint  Patient presents with  . Pulmonary Consult    Referred by Mariah Stevenson for eval of cough and pulmonary nodules. She c/o cough off and on since Jan 2017. Her cough has been better here recently, and is occ prod with clear sputum.    2 rounds of prednisone this year and did feel better and last used it mid May 2017  New doe = MMRC1 = can walk nl pace, flat grade, can't hurry or go uphills or steps s sob   Mucus clear/ cough since last round of prednisone but throat feels dry/ tight all the time but not while sleeping  rec Try symbicort 80 Take 2 puffs first thing in am and then another 2 puffs about 12 hours later.  Only use your albuterol as a rescue medication  Work on inhaler technique:   Change prilosec to Take 30-60 min before first meal of the day  GERD  Diet    10/24/2015  f/u ov/Mariah Stevenson re:  boop ? Cough variant asthma?  maint on symb 802bid but poor hfa Chief Complaint  Patient presents with  . Follow-up    Cough had got better, but cough returning this week,dry usually, clear occass.Denies sob or wheezing. Midchest tightness 1 episode.Only used Albuterol 1x in last week  was 100% improved then one week prior noted worse cough/tightness  with heat  humidity rec Plan A = Automatic = Symbicort 80 Take 2 puffs first thing in am and then another 2 puffs about 12 hours later.  Plan B = Backup Only use your albuterol (ventolin) as a rescue medication - Don't leave home without it !!  (think of it like the spare tire for your car)  Please schedule a follow up office visit in 6 weeks, call sooner if needed with cxr and pfts same day    12/05/2015  f/u ov/Mariah Stevenson re: cough variant asthma / maint rx = symb 80 2bid Chief Complaint  Patient presents with  . Follow-up    Cough is much improved. No new co's today. She has only used albuterol x 1 since her last visit.    had a dose pack of pred with last flare but never used it   No obvious day to day or daytime variability or assoc excess/ purulent sputum or mucus plugs    cp or   subjective wheeze or overt sinus or hb symptoms. No unusual exp hx or h/o childhood pna/ asthma or knowledge of premature birth.  Sleeping ok without nocturnal  or early am exacerbation  of respiratory  c/o's or need for noct saba. Also denies any obvious fluctuation of  symptoms with weather or environmental changes or other aggravating or alleviating factors except as outlined above   Current Medications, Allergies, Complete Past Medical History, Past Surgical History, Family History, and Social History were reviewed in [facility] record.  ROS  The following are not active complaints unless bolded sore throat, dysphagia, dental problems, itching, sneezing,  nasal congestion or excess/ purulent secretions, ear ache,   fever, chills, sweats, unintended wt loss, classically pleuritic or exertional cp, hemoptysis,  orthopnea pnd or leg swelling, presyncope, palpitations, abdominal pain, anorexia, nausea, vomiting, diarrhea  or change in bowel or bladder habits, change in stools or urine, dysuria,hematuria,  rash, arthralgias, visual complaints, headache, numbness, weakness or ataxia or problems with walking  or coordination,  change in mood/affect or memory.             Objective:   Physical Exam     Pleasant  amb wf   12/05/2015          195   10/24/2015       193   10/05/15 190 lb 9.6 oz (86.456 kg)  12/06/14 202 lb (91.627 kg)  06/21/13 202 lb (91.627 kg)    Vital signs reviewed / note sats 99% on arrival RA    HEENT: nl dentition, turbinates, and oropharynx. Nl external ear canals without cough reflex   NECK :  without JVD/Nodes/TM/ nl carotid upstrokes bilaterally   LUNGS: no acc muscle use,  Nl contour chest which is clear to A and P bilaterally without cough on insp or exp maneuvers   CV:  RRR  no s3 or murmur or increase in P2, no edema   ABD:  soft and nontender with nl inspiratory excursion in the supine position. No bruits or organomegaly, bowel sounds nl  MS:  Nl gait/ ext warm without deformities, calf tenderness, cyanosis or clubbing No obvious joint restrictions   SKIN: warm and dry without lesions    NEURO:  alert, approp, nl sensorium with  no motor deficits             I personally reviewed images and agree with radiology impression as follows:  CT Chest   10/09/15  1. There is a spectrum of findings in the lungs concerning for probable cryptogenic organizing pneumonia (COP), as above.   CXR PA and Lateral:   12/05/2015 :    I personally reviewed images and agree with radiology impression as follows:    Persistent interstitial prominence in the mid and lower lung zones without new opacity. No frank edema or consolidation. Stable cardiac silhouette. There is aortic atherosclerosis.      Lab Results  Component Value Date   ESRSEDRATE 35 (H) 12/05/2015   ESRSEDRATE 45 (H) 10/05/2015            Assessment:

## 2015-12-05 NOTE — Patient Instructions (Signed)
Please remember to go to the lab and x-ray department downstairs for your tests - we will call you with the results when they are available.  Please schedule a follow up office visit in 6 weeks, call sooner if needed [facility]

## 2015-12-06 NOTE — Progress Notes (Signed)
LMTCB

## 2015-12-08 NOTE — Progress Notes (Signed)
LMTCB

## 2015-12-10 ENCOUNTER — Encounter: Payer: Self-pay | Admitting: Internal Medicine

## 2015-12-10 NOTE — Assessment & Plan Note (Signed)
-  Spirometry 10/05/2015  FEV1 2.00 (81%)  Ratio 88 p am symb 160 > change to 80 2bid   - 10/24/2015  After extensive coaching HFA effectiveness =    75% from a baseline of 25%  All goals of chronic asthma control met including optimal function and elimination of symptoms with minimal need for rescue therapy.  Contingencies discussed in full including contacting this office immediately if not controlling the symptoms using the rule of two's.

## 2015-12-11 NOTE — Progress Notes (Signed)
lmtcb x3 

## 2015-12-12 NOTE — Progress Notes (Signed)
Spoke with pt and notified of results per Dr. Wert. Pt verbalized understanding and denied any questions. 

## 2016-01-02 DIAGNOSIS — G250 Essential tremor: Secondary | ICD-10-CM | POA: Diagnosis not present

## 2016-01-02 DIAGNOSIS — F3289 Other specified depressive episodes: Secondary | ICD-10-CM | POA: Diagnosis not present

## 2016-01-16 ENCOUNTER — Ambulatory Visit (INDEPENDENT_AMBULATORY_CARE_PROVIDER_SITE_OTHER): Payer: Medicare Other | Admitting: Internal Medicine

## 2016-01-16 ENCOUNTER — Encounter: Payer: Self-pay | Admitting: Internal Medicine

## 2016-01-16 ENCOUNTER — Other Ambulatory Visit (INDEPENDENT_AMBULATORY_CARE_PROVIDER_SITE_OTHER): Payer: Medicare Other

## 2016-01-16 ENCOUNTER — Ambulatory Visit (HOSPITAL_COMMUNITY)
Admission: RE | Admit: 2016-01-16 | Discharge: 2016-01-16 | Disposition: A | Discharge status: Home / Self Care | Payer: Medicare Other | Source: Ambulatory Visit | Attending: Internal Medicine | Admitting: Internal Medicine

## 2016-01-16 VITALS — BP 118/76 | HR 78 | Ht 66.0 in | Wt 195.2 lb

## 2016-01-16 DIAGNOSIS — J45991 Cough variant asthma: Secondary | ICD-10-CM

## 2016-01-16 DIAGNOSIS — R942 Abnormal results of pulmonary function studies: Secondary | ICD-10-CM | POA: Diagnosis not present

## 2016-01-16 DIAGNOSIS — R918 Other nonspecific abnormal finding of lung field: Secondary | ICD-10-CM

## 2016-01-16 LAB — PULMONARY FUNCTION TEST
DL/VA % PRED: 74 %
DL/VA: 3.75 ml/min/mmHg/L
DLCO UNC: 13.02 ml/min/mmHg
DLCO unc % pred: 48 %
FEF 25-75 POST: 3.3 L/s
FEF 25-75 PRE: 2.91 L/s
FEF2575-%Change-Post: 13 %
FEF2575-%PRED-PRE: 146 %
FEF2575-%Pred-Post: 165 %
FEV1-%Change-Post: 5 %
FEV1-%PRED-POST: 85 %
FEV1-%Pred-Pre: 81 %
FEV1-PRE: 1.98 L
FEV1-Post: 2.08 L
FEV1FVC-%CHANGE-POST: 0 %
FEV1FVC-%Pred-Pre: 115 %
FEV6-%CHANGE-POST: 4 %
FEV6-%PRED-POST: 76 %
FEV6-%PRED-PRE: 73 %
FEV6-POST: 2.35 L
FEV6-PRE: 2.25 L
FEV6FVC-%PRED-POST: 104 %
FEV6FVC-%PRED-PRE: 104 %
FVC-%Change-Post: 4 %
FVC-%Pred-Post: 72 %
FVC-%Pred-Pre: 69 %
FVC-Post: 2.35 L
FVC-Pre: 2.25 L
POST FEV6/FVC RATIO: 100 %
PRE FEV1/FVC RATIO: 88 %
Post FEV1/FVC ratio: 89 %
Pre FEV6/FVC Ratio: 100 %
RV % pred: 68 %
RV: 1.56 L
TLC % PRED: 74 %
TLC: 3.99 L

## 2016-01-16 LAB — CBC WITH DIFFERENTIAL/PLATELET
Basophils Absolute: 0 10*3/uL (ref 0.0–0.1)
Basophils Relative: 0.1 % (ref 0.0–3.0)
EOS PCT: 0.6 % (ref 0.0–5.0)
Eosinophils Absolute: 0 10*3/uL (ref 0.0–0.7)
HEMATOCRIT: 35 % — AB (ref 36.0–46.0)
HEMOGLOBIN: 11.6 g/dL — AB (ref 12.0–15.0)
LYMPHS PCT: 23.8 % (ref 12.0–46.0)
Lymphs Abs: 2.1 10*3/uL (ref 0.7–4.0)
MCHC: 33.2 g/dL (ref 30.0–36.0)
MCV: 85.7 fl (ref 78.0–100.0)
MONO ABS: 0.5 10*3/uL (ref 0.1–1.0)
Monocytes Relative: 5.7 % (ref 3.0–12.0)
Neutro Abs: 6.1 10*3/uL (ref 1.4–7.7)
Neutrophils Relative %: 69.8 % (ref 43.0–77.0)
Platelets: 291 10*3/uL (ref 150.0–400.0)
RBC: 4.09 Mil/uL (ref 3.87–5.11)
RDW: 14.9 % (ref 11.5–15.5)
WBC: 8.8 10*3/uL (ref 4.0–10.5)

## 2016-01-16 LAB — SEDIMENTATION RATE: SED RATE: 37 mm/h — AB (ref 0–30)

## 2016-01-16 MED ORDER — ALBUTEROL SULFATE (2.5 MG/3ML) 0.083% IN NEBU
2.5000 mg | INHALATION_SOLUTION | Freq: Once | RESPIRATORY_TRACT | Status: AC
  Administered 2016-01-16: 2.5 mg via RESPIRATORY_TRACT

## 2016-01-16 NOTE — Progress Notes (Signed)
Subjective:     Patient ID: Mariah Stevenson, female   DOB: Mar 29, 1946    MRN: [id number]     Brief patient profile:  86 yowf quit smoking in 1995 with a tendency then to recurrent  bronchitis which resolved completely and did fine until winter of 2016 and since then recurrent pattern of episodic cough esp  since winter 2017 but  even between bouts did ok until off maint rx until late spring/early summer 2017    then   needed maint for the first time = symbicort 160 2bid and referred to pulmonary clinic 10/05/2015 by Dr Izola Price for f/u of abn CT chest from 07/25/15 with a nl baseline cxr at that point with evidence of BOOP on HRCT 10/09/15    History of Present Illness  10/05/2015 1st [facility] office visit/ Richar Dunklee   Chief Complaint  Patient presents with  . Pulmonary Consult    Referred by Dr Carlyon Shadow for eval of cough and pulmonary nodules. She c/o cough off and on since Jan 2017. Her cough has been better here recently, and is occ prod with clear sputum.    2 rounds of prednisone this year and did feel better and last used it mid May 2017  New doe = MMRC1 = can walk nl pace, flat grade, can't hurry or go uphills or steps s sob   Mucus clear/ cough since last round of prednisone but throat feels dry/ tight all the time but not while sleeping  rec Try symbicort 80 Take 2 puffs first thing in am and then another 2 puffs about 12 hours later.  Only use your albuterol as a rescue medication  Work on inhaler technique:   Change prilosec to Take 30-60 min before first meal of the day  GERD  Diet    10/24/2015  f/u ov/Tyquisha Sharps re:  boop ? Cough variant asthma?  maint on symb 802bid but poor hfa Chief Complaint  Patient presents with  . Follow-up    Cough had got better, but cough returning this week,dry usually, clear occass.Denies sob or wheezing. Midchest tightness 1 episode.Only used Albuterol 1x in last week  was 100% improved then one week prior noted worse cough/tightness  with heat  humidity rec Plan A = Automatic = Symbicort 80 Take 2 puffs first thing in am and then another 2 puffs about 12 hours later.  Plan B = Backup Only use your albuterol (ventolin) as a rescue medication        01/16/2016  f/u ov/Lamonica Trueba re: cough variant asthma/ maint rx symb 80 2bid / no need for rescue/ no recent pred Chief Complaint  Patient presents with  . Follow-up    Had her PFT Pt. states breathing is doing good, Using her symbicort as normal, hasn't needed her albuterol    Not limited by breathing from desired activities    No obvious day to day or daytime variability or assoc excess/ purulent sputum or mucus plugs    cp or   subjective wheeze or overt sinus or hb symptoms. No unusual exp hx or h/o childhood pna/ asthma or knowledge of premature birth.  Sleeping ok without nocturnal  or early am exacerbation  of respiratory  c/o's or need for noct saba. Also denies any obvious fluctuation of symptoms with weather or environmental changes or other aggravating or alleviating factors except as outlined above   Current Medications, Allergies, Complete Past Medical History, Past Surgical History, Family History, and Social History were reviewed  in [facility] electronic medical record.  ROS  The following are not active complaints unless bolded sore throat, dysphagia, dental problems, itching, sneezing,  nasal congestion or excess/ purulent secretions, ear ache,   fever, chills, sweats, unintended wt loss, classically pleuritic or exertional cp, hemoptysis,  orthopnea pnd or leg swelling, presyncope, palpitations, abdominal pain, anorexia, nausea, vomiting, diarrhea  or change in bowel or bladder habits, change in stools or urine, dysuria,hematuria,  rash, arthralgias, visual complaints, headache, numbness, weakness or ataxia or problems with walking or coordination,  change in mood/affect or memory.             Objective:   Physical Exam     Pleasant  amb wf   01/16/2016      195  12/05/2015          195   10/24/2015       193   10/05/15 190 lb 9.6 oz (86.456 kg)  12/06/14 202 lb (91.627 kg)  06/21/13 202 lb (91.627 kg)    Vital signs reviewed - Note on arrival 02 sats  98% on RA     HEENT: nl dentition, turbinates, and oropharynx. Nl external ear canals without cough reflex   NECK :  without JVD/Nodes/TM/ nl carotid upstrokes bilaterally   LUNGS: no acc muscle use,  Nl contour chest which is clear to A and P bilaterally without cough on insp or exp maneuvers   CV:  RRR  no s3 or murmur or increase in P2, no edema   ABD:  soft and nontender with nl inspiratory excursion in the supine position. No bruits or organomegaly, bowel sounds nl  MS:  Nl gait/ ext warm without deformities, calf tenderness, cyanosis or clubbing No obvious joint restrictions   SKIN: warm and dry without lesions    NEURO:  alert, approp, nl sensorium with  no motor deficits      I personally reviewed images and agree with radiology impression as follows:  CT Chest   10/09/15  1. There is a spectrum of findings in the lungs concerning for probable cryptogenic organizing pneumonia (COP), as above.   CXR PA and Lateral:   12/05/2015 :    I personally reviewed images and agree with radiology impression as follows:    Persistent interstitial prominence in the mid and lower lung zones without new opacity. No frank edema or consolidation. Stable cardiac silhouette. There is aortic atherosclerosis.        Lab Results  Component Value Date   ESRSEDRATE 37 (H) 01/16/2016   ESRSEDRATE 35 (H) 12/05/2015   ESRSEDRATE 45 (H) 10/05/2015            Assessment:

## 2016-01-16 NOTE — Patient Instructions (Addendum)
Pantoprazole (protonix) 40 mg   Take  30-60 min before first meal of the day and when coughing add Pepcid (famotidine)  20 mg one @  bedtime until return to office - this is the best way to tell whether stomach acid is contributing to your problem.    Please remember to go to the lab  department downstairs for your tests - we will call you with the results when they are available.  Ok to try off symbiocort to see what change if any you notice and if worse start back on it right away    If you are satisfied with your treatment plan,  let your doctor know and he/she can either refill your medications or you can return here when your prescription runs out.     If in any way you are not 100% satisfied,  please tell us.  If 100% better, tell your friends!  Pulmonary follow up is as needed

## 2016-01-17 LAB — RESPIRATORY ALLERGY PROFILE REGION II ~~LOC~~
Allergen, C. Herbarum, M2: <0.1 kU/L
Allergen, Cedar tree, t12: <0.1 kU/L
Allergen, Comm Silver Birch, t9: <0.1 kU/L
Allergen, Mouse Urine Protein, e78: <0.1 kU/L
Allergen, Mulberry, t76: <0.1 kU/L
Allergen, P. notatum, m1: <0.1 kU/L
Aspergillus fumigatus, m3: <0.1 kU/L
Box Elder IgE: <0.1 kU/L
Cockroach: <0.1 kU/L
Common Ragweed: <0.1 kU/L
Elm IgE: <0.1 kU/L
IgE (Immunoglobulin E), Serum: 67 kU/L (ref <115)
Sheep Sorrel IgE: <0.1 kU/L

## 2016-01-18 ENCOUNTER — Telehealth: Payer: Self-pay | Admitting: Internal Medicine

## 2016-01-18 NOTE — Progress Notes (Signed)
lmtcb

## 2016-01-18 NOTE — Telephone Encounter (Signed)
Notes Recorded by Christen ButterLeslie M Raskin, CMA on 01/18/2016 at 9:38 AM EDT lmtcb ------ Notes Recorded by Nyoka CowdenMichael B Wert, MD on 01/17/2016 at 5:26 PM EDT Call patient : Studies are unremarkable, no change in recs --------------------------------------------------- Spoke with pt. She is aware of her results. Nothing further was needed.

## 2016-01-21 NOTE — Assessment & Plan Note (Signed)
CT chest 07/25/15 c/w bilateral GG changes s appearance on cxr - HSP 10/05/15  Neg serologies - ESR 10/05/2015  = 45 with conspicuous nodules on cxr  - CT chest 10/09/15 >>> probable cryptogenic organizing pneumonia (COP), - ESR 12/05/2015 = 35  - PFT's  01/16/2016    wnl p symbicort 80 x 2  prior to study with DLCO  48% corrects to 74  % for alv volume    ESR trending down and not limited at all in terms of activity/ no flare off systemic steroids, so f/u can be prn  Discussed in detail all the  indications, usual  risks and alternatives  relative to the benefits with patient who agrees to proceed with conservative f/u as outlined    I had an extended summary  discussion with the patient reviewing all relevant studies completed to date and  lasting 15 to 20 minutes of a 25 minute visit    Each maintenance medication was reviewed in detail including most importantly the difference between maintenance and prns and under what circumstances the prns are to be triggered using an action plan format that is not reflected in the computer generated alphabetically organized AVS.    Please see instructions for details which were reviewed in writing and the patient given a copy highlighting the part that I personally wrote and discussed at today's ov.

## 2016-01-21 NOTE — Assessment & Plan Note (Signed)
-   Spirometry 10/05/2015  FEV1 2.00 (81%)  Ratio 88 p am symb 160 > change to 80 2bid   - 10/24/2015  After extensive coaching HFA effectiveness =    75% from a baseline of 25% - Allergy profile 01/16/16 >  Eos 0.0/  IgE  67 neg RAST  - PFT's 01/16/2016 no airflow obstruction   Since her baseline hfa use is so poor and her airflow so normal it may well be she can try off symbicort to see if cough flares, and if not stay off it and f/u here prn at this point

## 2016-05-29 DIAGNOSIS — Z0000 Encounter for general adult medical examination without abnormal findings: Secondary | ICD-10-CM | POA: Diagnosis not present

## 2016-05-29 DIAGNOSIS — D473 Essential (hemorrhagic) thrombocythemia: Secondary | ICD-10-CM | POA: Diagnosis not present

## 2016-05-29 DIAGNOSIS — K279 Peptic ulcer, site unspecified, unspecified as acute or chronic, without hemorrhage or perforation: Secondary | ICD-10-CM | POA: Diagnosis not present

## 2016-05-29 DIAGNOSIS — F322 Major depressive disorder, single episode, severe without psychotic features: Secondary | ICD-10-CM | POA: Diagnosis not present

## 2016-06-17 ENCOUNTER — Telehealth: Payer: Self-pay | Admitting: Internal Medicine

## 2016-06-17 MED ORDER — PREDNISONE 10 MG PO TABS: ORAL_TABLET | ORAL | 0 refills | Status: DC

## 2016-06-17 NOTE — Telephone Encounter (Signed)
lmtcb X1 for pt.  pred taper sent to pharmacy.

## 2016-06-17 NOTE — Telephone Encounter (Signed)
Strongly prefer she see one of us or PC since 5 months since prev eval but if declines ok to try :  Prednisone 10 mg take  4 each am x 2 days,   2 each am x 2 days,  and stop

## 2016-06-17 NOTE — Telephone Encounter (Signed)
Spoke with pt. States that she is not feeling well. Reports cough, sinus congestion, PND and chest congestion. Cough is producing clear mucus. Denies chest tightness, wheezing, SOB or fever. Pt states that she would like to have a prescription for prednisone.  MW - please advise. Thanks.

## 2016-06-18 NOTE — Telephone Encounter (Signed)
Spoke with pt,aware of recs.  Nothing further needed.  

## 2016-06-18 NOTE — Telephone Encounter (Signed)
Patient returning phone call 

## 2016-07-11 DIAGNOSIS — M810 Age-related osteoporosis without current pathological fracture: Secondary | ICD-10-CM | POA: Diagnosis not present

## 2016-07-15 ENCOUNTER — Ambulatory Visit (INDEPENDENT_AMBULATORY_CARE_PROVIDER_SITE_OTHER)
Admission: RE | Admit: 2016-07-15 | Discharge: 2016-07-15 | Disposition: A | Discharge status: Home / Self Care | Payer: Medicare Other | Source: Ambulatory Visit | Attending: Internal Medicine | Admitting: Internal Medicine

## 2016-07-15 ENCOUNTER — Ambulatory Visit (INDEPENDENT_AMBULATORY_CARE_PROVIDER_SITE_OTHER): Payer: Medicare Other | Admitting: Internal Medicine

## 2016-07-15 ENCOUNTER — Other Ambulatory Visit (INDEPENDENT_AMBULATORY_CARE_PROVIDER_SITE_OTHER): Payer: Medicare Other

## 2016-07-15 ENCOUNTER — Encounter: Payer: Self-pay | Admitting: Internal Medicine

## 2016-07-15 VITALS — BP 120/72 | HR 68 | Ht 66.0 in | Wt 199.2 lb

## 2016-07-15 DIAGNOSIS — R918 Other nonspecific abnormal finding of lung field: Secondary | ICD-10-CM

## 2016-07-15 DIAGNOSIS — R05 Cough: Secondary | ICD-10-CM | POA: Diagnosis not present

## 2016-07-15 DIAGNOSIS — J45991 Cough variant asthma: Secondary | ICD-10-CM

## 2016-07-15 LAB — CBC WITH DIFFERENTIAL/PLATELET
BASOS PCT: 0.6 % (ref 0.0–3.0)
Basophils Absolute: 0.1 10*3/uL (ref 0.0–0.1)
EOS ABS: 0.1 10*3/uL (ref 0.0–0.7)
Eosinophils Relative: 0.7 % (ref 0.0–5.0)
HCT: 35.9 % — ABNORMAL LOW (ref 36.0–46.0)
HEMOGLOBIN: 11.7 g/dL — AB (ref 12.0–15.0)
Lymphocytes Relative: 24.3 % (ref 12.0–46.0)
Lymphs Abs: 2.9 10*3/uL (ref 0.7–4.0)
MCHC: 32.5 g/dL (ref 30.0–36.0)
MCV: 87.2 fl (ref 78.0–100.0)
MONO ABS: 0.8 10*3/uL (ref 0.1–1.0)
Monocytes Relative: 6.5 % (ref 3.0–12.0)
NEUTROS ABS: 8.2 10*3/uL — AB (ref 1.4–7.7)
Neutrophils Relative %: 67.9 % (ref 43.0–77.0)
PLATELETS: 295 10*3/uL (ref 150.0–400.0)
RBC: 4.11 Mil/uL (ref 3.87–5.11)
RDW: 14.5 % (ref 11.5–15.5)
WBC: 12 10*3/uL — AB (ref 4.0–10.5)

## 2016-07-15 LAB — SEDIMENTATION RATE: SED RATE: 26 mm/h (ref 0–30)

## 2016-07-15 MED ORDER — PREDNISONE 10 MG PO TABS: ORAL_TABLET | ORAL | 0 refills | Status: DC

## 2016-07-15 NOTE — Patient Instructions (Addendum)
Prilosec  40 mg   Take  30-60 min before first meal of the day and add back Pepcid (famotidine)  20 mg one @  bedtime until return to office - this is the best way to tell whether stomach acid is contributing to your problem.   GERD (REFLUX)  is an extremely common cause of respiratory symptoms just like yours , many times with no obvious heartburn at all.    It can be treated with medication, but also with lifestyle changes including elevation of the head of your bed (ideally with 6 inch  bed blocks),  Smoking cessation, avoidance of late meals, excessive alcohol, and avoid fatty foods, chocolate, peppermint, colas, red wine, and acidic juices such as orange juice.  NO MINT OR MENTHOL PRODUCTS SO NO COUGH DROPS   USE SUGARLESS CANDY INSTEAD (Jolley ranchers or Stover's or Life Savers) or even ice chips will also do - the key is to swallow to prevent all throat clearing. NO OIL BASED VITAMINS - use powdered substitutes.    Prednisone 10 mg take  4 each am x 2 days,   2 each am x 2 days,  1 each am x 2 days and stop   Please remember to go to the lab and x-ray department downstairs in the basement  for your tests - we will call you with the results when they are available.     Please schedule a follow up office visit in 4 weeks, sooner if needed

## 2016-07-15 NOTE — Progress Notes (Signed)
Subjective:     Patient ID: Mariah Stevenson, female   DOB: [birth year]    MRN: [id number]     Brief patient profile:  [age] yowf quit smoking in 1995 with a tendency then to recurrent  bronchitis which resolved completely and did fine until winter of 2016 and since then recurrent pattern of episodic cough esp  since winter 2017 but  even between bouts did ok until off maint rx until late spring/early summer 2017    then   needed maint for the first time = symbicort 160 2bid and referred to pulmonary clinic 10/05/2015 by Dr Izola PriceMyers for f/u of abn CT chest from 07/25/15 with a nl baseline cxr at that point with evidence of BOOP on HRCT 10/09/15    History of Present Illness  10/05/2015 1st [facility] office visit/ Pearlene Teat   Chief Complaint  Patient presents with  . Pulmonary Consult    Referred by Dr Carlyon ShadowSteven Myers for eval of cough and pulmonary nodules. She c/o cough off and on since Jan 2017. Her cough has been better here recently, and is occ prod with clear sputum.    2 rounds of prednisone this year and did feel better and last used it mid May 2017  New doe = MMRC1 = can walk nl pace, flat grade, can't hurry or go uphills or steps s sob   Mucus clear/ cough since last round of prednisone but throat feels dry/ tight all the time but not while sleeping  rec Try symbicort 80 Take 2 puffs first thing in am and then another 2 puffs about 12 hours later.  Only use your albuterol as a rescue medication  Work on inhaler technique:   Change prilosec to Take 30-60 min before first meal of the day  GERD  Diet    10/24/2015  f/u ov/Amario Longmore re:  boop ? Cough variant asthma?  maint on symb 802bid but poor hfa Chief Complaint  Patient presents with  . Follow-up    Cough had got better, but cough returning this week,dry usually, clear occass.Denies sob or wheezing. Midchest tightness 1 episode.Only used Albuterol 1x in last week  was 100% improved then one week prior noted worse cough/tightness  with heat  humidity rec Plan A = Automatic = Symbicort 80 Take 2 puffs first thing in am and then another 2 puffs about 12 hours later.  Plan B = Backup Only use your albuterol (ventolin) as a rescue medication        01/16/2016  f/u ov/Mattison Golay re: cough variant asthma/ maint rx symb 80 2bid / no need for rescue/ no recent pred Chief Complaint  Patient presents with  . Follow-up    Had her PFT Pt. states breathing is doing good, Using her symbicort as normal, hasn't needed her albuterol   Not limited by breathing from desired activities   rec Pantoprazole (protonix) 40 mg   Take  30-60 min before first meal of the day and when coughing add Pepcid (famotidine)  20 mg one @  bedtime until return to office - this is the best way to tell whether stomach acid is contributing to your problem.   Please remember to go to the lab  department downstairs for your tests - we will call you with the results when they are available. Ok to try off symbiocort to see what change if any you notice and if worse start back on it right away     07/15/2016  f/u ov/Phuc Kluttz re: ?  boop/ ? Cough variant asthma  Chief Complaint  Patient presents with  . Acute Visit    Was on Symbicort but stop taking it after a few months. Felt fine after the first few weeks, but the cough has returned. Was given a prednisone taper and it helped some but the cough is still here. Mainly a non-productive cough but when it is productive, clear phlegm.    at baseline off all inhalers x months on prilosec before bfast no pepcid  Cough started late dec 2017 / early jan 2018 and started symbicort 80 and maybe 25% better and pred helped briefly then relapsed while still taking symbicort 80  Cough worse p stirs but Does not awaken her prematurely.  No obvious day to day or daytime variability or assoc excess/ purulent sputum or mucus plugs or hemoptysis or cp or chest tightness, subjective wheeze or overt sinus or hb symptoms. No unusual exp hx or h/o  childhood pna/ asthma or knowledge of premature birth.  Sleeping ok without nocturnal  or early am exacerbation  of respiratory  c/o's or need for noct saba. Also denies any obvious fluctuation of symptoms with weather or environmental changes or other aggravating or alleviating factors except as outlined above   Current Medications, Allergies, Complete Past Medical History, Past Surgical History, Family History, and Social History were reviewed in [employer] [facility] record.  ROS  The following are not active complaints unless bolded sore throat, dysphagia, dental problems, itching, sneezing,  nasal congestion or excess/ purulent secretions, ear ache,   fever, chills, sweats, unintended wt loss, classically pleuritic or exertional cp,  orthopnea pnd or leg swelling, presyncope, palpitations, abdominal pain, anorexia, nausea, vomiting, diarrhea  or change in bowel or bladder habits, change in stools or urine, dysuria,hematuria,  rash, arthralgias, visual complaints, headache, numbness, weakness or ataxia or problems with walking or coordination,  change in mood/affect or memory.                 Objective:   Physical Exam     Pleasant  amb wf nad    07/15/2016       199  01/16/2016     195  12/05/2015          195   10/24/2015       193   10/05/15 190 lb 9.6 oz (86.456 kg)  12/06/14 202 lb (91.627 kg)  06/21/13 202 lb (91.627 kg)    Vital signs reviewed - Note on arrival 02 sats  98% on RA     HEENT: nl dentition, turbinates, and oropharynx. Nl external ear canals without cough reflex   NECK :  without JVD/Nodes/TM/ nl carotid upstrokes bilaterally   LUNGS: no acc muscle use,  Nl contour chest which is clear to A and P bilaterally without cough on insp or exp maneuvers   CV:  RRR  no s3 or murmur or increase in P2, no edema   ABD:  soft and nontender with nl inspiratory excursion in the supine position. No bruits or organomegaly, bowel sounds nl  MS:  Nl gait/  ext warm without deformities, calf tenderness, cyanosis or clubbing No obvious joint restrictions   SKIN: warm and dry without lesions    NEURO:  alert, approp, nl sensorium with  no motor deficits       CXR PA and Lateral:   07/15/2016 :    I personally reviewed images and agree with radiology impression as follows:   COPD-reactive airway  disease. No pneumonia, CHF, nor other acute cardiopulmonary abnormality.     Labs ordered/ reviewed:      Chemistry      Component Value Date/Time   NA 135 10/05/2015 1423   K 4.0 10/05/2015 1423   CL 99 10/05/2015 1423   CO2 27 10/05/2015 1423   BUN 10 10/05/2015 1423   CREATININE 0.70 10/05/2015 1423      Component Value Date/Time   CALCIUM 9.5 10/05/2015 1423        Lab Results  Component Value Date   WBC 12.0 (H) 07/15/2016   HGB 11.7 (L) 07/15/2016   HCT 35.9 (L) 07/15/2016   MCV 87.2 07/15/2016   PLT 295.0 07/15/2016          Lab Results  Component Value Date   ESRSEDRATE 26 07/15/2016   ESRSEDRATE 37 (H) 01/16/2016   ESRSEDRATE 35 (H) 12/05/2015                  Assessment:

## 2016-07-15 NOTE — Progress Notes (Signed)
Spoke with pt and notified of results per Dr. Wert. Pt verbalized understanding and denied any questions. 

## 2016-07-17 ENCOUNTER — Encounter: Payer: Self-pay | Admitting: Internal Medicine

## 2016-07-17 DIAGNOSIS — E669 Obesity, unspecified: Secondary | ICD-10-CM | POA: Insufficient documentation

## 2016-07-17 NOTE — Assessment & Plan Note (Signed)
Body mass index is 32.15 kg/m.  -  trending up  Lab Results  Component Value Date   TSH 1.68 10/05/2015     Contributing to gerd risk/ doe/reviewed the need and the process to achieve and maintain neg calorie balance > defer f/u primary care including intermittently monitoring thyroid status

## 2016-07-17 NOTE — Assessment & Plan Note (Signed)
-   Spirometry 10/05/2015  FEV1 2.00 (81%)  Ratio 88 p am symb 160 > change to 80 2bid   - 10/24/2015  After extensive coaching HFA effectiveness =    75% from a baseline of 25% - Allergy profile 01/16/16 >  Eos 0.0/  IgE  67 neg RAST  - PFT's 01/16/2016 no airflow obstruction   There really is no clinical evidence of asthma at this point. The fact that Mariah Stevenson can sleep through the night and only cough after Mariah Stevenson stirs is strongly against asthma therefore recommended focusing on treating the upper airway component of her problem and just using albuterol if needed for breathing.

## 2016-07-17 NOTE — Assessment & Plan Note (Signed)
CT chest 07/25/15 c/w bilateral GG changes s appearance on cxr - HSP 10/05/15  Neg serologies - ESR 10/05/2015  = 45 with conspicuous nodules on cxr  - CT chest 10/09/15 >>> probable cryptogenic organizing pneumonia (COP), - ESR 12/05/2015 = 35  - PFT's  01/16/2016    wnl p symbicort 80 x 2  prior to study with DLCO  48% corrects to 74  % for alv volume     No evidence at all to support recurrence of Boop at this point.  Of the three most common causes of  Sub-acute or recurrent or chronic cough, only one (GERD)  can actually contribute to/ trigger  the other two (asthma and post nasal drip syndrome)  and perpetuate the cylce of cough.  While not intuitively obvious, many patients with chronic low grade reflux do not cough until there is a primary insult that disturbs the protective epithelial barrier and exposes sensitive nerve endings.   This is typically viral but can be direct physical injury such as with an endotracheal tube.   The point is that once this occurs, it is difficult to eliminate the cycle  using anything but a maximally effective acid suppression regimen at least in the short run, accompanied by an appropriate diet to address non acid GERD and control / eliminate the cough itself for at least 3 days.   Recommend maximal treatment directed at reflux and cyclical coughing then regroup here in 4 weeks    I had an extended discussion with the patient reviewing all relevant studies completed to date and  lasting 15 to 20 minutes of a 25 minute acute visit    Each maintenance medication was reviewed in detail including most importantly the difference between maintenance and prns and under what circumstances the prns are to be triggered using an action plan format that is not reflected in the computer generated alphabetically organized AVS.    Please see AVS for specific instructions unique to this visit that I personally wrote and verbalized to the the pt in detail and then reviewed with pt   by my nurse highlighting any  changes in therapy recommended at today's visit to their plan of care.   

## 2016-07-26 DIAGNOSIS — E7800 Pure hypercholesterolemia, unspecified: Secondary | ICD-10-CM | POA: Diagnosis not present

## 2016-07-26 DIAGNOSIS — M810 Age-related osteoporosis without current pathological fracture: Secondary | ICD-10-CM | POA: Diagnosis not present

## 2016-08-12 ENCOUNTER — Encounter: Payer: Self-pay | Admitting: Internal Medicine

## 2016-08-12 ENCOUNTER — Ambulatory Visit (INDEPENDENT_AMBULATORY_CARE_PROVIDER_SITE_OTHER): Payer: Medicare Other | Admitting: Internal Medicine

## 2016-08-12 VITALS — BP 124/84 | HR 66 | Ht 66.5 in | Wt 198.2 lb

## 2016-08-12 DIAGNOSIS — R918 Other nonspecific abnormal finding of lung field: Secondary | ICD-10-CM

## 2016-08-12 DIAGNOSIS — J45991 Cough variant asthma: Secondary | ICD-10-CM | POA: Diagnosis not present

## 2016-08-12 NOTE — Assessment & Plan Note (Signed)
Body mass index is 31.51 kg/m.  -  trending down, encouraged Lab Results  Component Value Date   TSH 1.68 10/05/2015     Contributing to gerd risk/ doe/reviewed the need and the process to achieve and maintain neg calorie balance > defer f/u primary care including intermittently monitoring thyroid status

## 2016-08-12 NOTE — Assessment & Plan Note (Signed)
CT chest 07/25/15 c/w bilateral GG changes s appearance on cxr - HSP 10/05/15  Neg serologies - ESR 10/05/2015  = 45 with conspicuous nodules on cxr  - CT chest 10/09/15 >>> probable cryptogenic organizing pneumonia (COP), - ESR 12/05/2015 = 35  - PFT's  01/16/2016    wnl p symbicort 80 x 2  prior to study with DLCO  48% corrects to 74  % for alv volume   - Prednisone rx  07/15/16 no change symptoms     Lab Results  Component Value Date   ESRSEDRATE 26 07/15/2016   ESRSEDRATE 37 (H) 01/16/2016   ESRSEDRATE 35 (H) 12/05/2015     No evidence clinically of ILD of any type, no f/u for this issue planned unless new symptoms develop

## 2016-08-12 NOTE — Assessment & Plan Note (Signed)
-   Spirometry 10/05/2015  FEV1 2.00 (81%)  Ratio 88 p am symb 160 > change to 80 2bid   - 10/24/2015  After extensive coaching HFA effectiveness =    75% from a baseline of 25% - Allergy profile 01/16/16 >  Eos 0.0/  IgE  67 neg RAST  - PFT's 01/16/2016 no airflow obstruction   - Sinus CT 08/12/2016 >>>   Upper airway cough syndrome (previously labeled PNDS) , is  so named because it's frequently impossible to sort out how much is  CR/sinusitis with freq throat clearing (which can be related to primary GERD)   vs  causing  secondary (" extra esophageal")  GERD from wide swings in gastric pressure that occur with throat clearing, often  promoting self use of mint and menthol lozenges that reduce the lower esophageal sphincter tone and exacerbate the problem further in a cyclical fashion.   These are the same pts (now being labeled as having "irritable larynx syndrome" by some cough centers) who not infrequently have a history of having failed to tolerate ace inhibitors,  dry powder inhalers (and sometimes even hfa low dose symb 2 bid)  or biphosphonates or report having atypical/extraesophageal reflux symptoms that don't respond to standard doses of PPI  and are easily confused as having aecopd or asthma flares by even experienced allergists/ pulmonologists (myself included).   To sort thru ddx rec trial of symb 80 just in am to see if any noct symptoms flare and if so resume bid dosing and in meantime proceed with sinus ct and if neg as suspect it will be then trial of gabapentin for irritable larynx syndrome  I had an extended discussion with the patient reviewing all relevant studies completed to date and  lasting 15 to 20 minutes of a 25 minute visit    Each maintenance medication was reviewed in detail including most importantly the difference between maintenance and prns and under what circumstances the prns are to be triggered using an action plan format that is not reflected in the computer  generated alphabetically organized AVS.    Please see AVS for specific instructions unique to this visit that I personally wrote and verbalized to the the pt in detail and then reviewed with pt  by my nurse highlighting any  changes in therapy recommended at today's visit to their plan of care.

## 2016-08-12 NOTE — Progress Notes (Signed)
Subjective:     Patient ID: Mariah Stevenson, female   DOB: [birth year]    MRN: [id number]     Brief patient profile:  [id number] yowf quit smoking in 1995 with a tendency then to recurrent  bronchitis which resolved completely and did fine until winter of 2016 and since then recurrent pattern of episodic cough esp  since winter 2017 but  even between bouts did ok until off maint rx until late spring/early summer 2017    then   needed maint for the first time = symbicort 160 2bid and referred to pulmonary clinic 10/05/2015 by Dr Izola PriceMyers for f/u of abn CT chest from 07/25/15 with a nl baseline cxr at that point with evidence of BOOP on HRCT 10/09/15    History of Present Illness  10/05/2015 1st [facility] office visit/ Creig Landin   Chief Complaint  Patient presents with  . Pulmonary Consult    Referred by Dr Carlyon ShadowSteven Myers for eval of cough and pulmonary nodules. She c/o cough off and on since Jan 2017. Her cough has been better here recently, and is occ prod with clear sputum.    2 rounds of prednisone this year and did feel better and last used it mid May 2017  New doe = MMRC1 = can walk nl pace, flat grade, can't hurry or go uphills or steps s sob   Mucus clear/ cough since last round of prednisone but throat feels dry/ tight all the time but not while sleeping  rec Try symbicort 80 Take 2 puffs first thing in am and then another 2 puffs about 12 hours later.  Only use your albuterol as a rescue medication  Work on inhaler technique:   Change prilosec to Take 30-60 min before first meal of the day  GERD  Diet    10/24/2015  f/u ov/Dillan Candela re:  boop ? Cough variant asthma?  maint on symb 802bid but poor hfa Chief Complaint  Patient presents with  . Follow-up    Cough had got better, but cough returning this week,dry usually, clear occass.Denies sob or wheezing. Midchest tightness 1 episode.Only used Albuterol 1x in last week  was 100% improved then one week prior noted worse cough/tightness  with heat  humidity rec Plan A = Automatic = Symbicort 80 Take 2 puffs first thing in am and then another 2 puffs about 12 hours later.  Plan B = Backup Only use your albuterol (ventolin) as a rescue medication        01/16/2016  f/u ov/Eddie Koc re: cough variant asthma/ maint rx symb 80 2bid / no need for rescue/ no recent pred Chief Complaint  Patient presents with  . Follow-up    Had her PFT Pt. states breathing is doing good, Using her symbicort as normal, hasn't needed her albuterol   Not limited by breathing from desired activities   rec Pantoprazole (protonix) 40 mg   Take  30-60 min before first meal of the day and when coughing add Pepcid (famotidine)  20 mg one @  bedtime until return to office - this is the best way to tell whether stomach acid is contributing to your problem.   Please remember to go to the lab  department downstairs for your tests - we will call you with the results when they are available. Ok to try off symbiocort to see what change if any you notice and if worse start back on it right away     07/15/2016  f/u ov/Jatavius Ellenwood re: ?  boop/ ? Cough variant asthma  Chief Complaint  Patient presents with  . Acute Visit    Was on Symbicort but stop taking it after a few months. Felt fine after the first few weeks, but the cough has returned. Was given a prednisone taper and it helped some but the cough is still here. Mainly a non-productive cough but when it is productive, clear phlegm.    at baseline off all inhalers x months on prilosec before bfast no pepcid  Cough started late dec 2017 / early jan 2018 and started symbicort 80 and maybe 25% better and pred helped briefly then relapsed while still taking symbicort 80  Cough worse p stirs but Does not awaken her prematurely. rec  Prilosec  40 mg   Take  30-60 min before first meal of the day and add back Pepcid (famotidine)  20 mg one @  bedtime until return to office - this is the best way to tell whether stomach acid is  contributing to your problem.  GERD Prednisone 10 mg take  4 each am x 2 days,   2 each am x 2 days,  1 each am x 2 days and stop      08/12/2016  f/u ov/Asley Baskerville re: f/u boop/ cough variant asthma on symb 80 2bid/ max gerd rx  Chief Complaint  Patient presents with  . Follow-up    Breathing is doing well today. Her cough is unchanged. No new co's today.    cough is almost all daytime with sensation of globus assoc with nasal congestion  but no excess/ purulent sputum or mucus plugs  - not better p prednisone   Not limited by breathing from desired activities    No obvious day to day or daytime variability or assoc cp or chest tightness, subjective wheeze or overt   hb symptoms. No unusual exp hx or h/o childhood pna/ asthma or knowledge of premature birth.  Sleeping ok without nocturnal  or early am exacerbation  of respiratory  c/o's or need for noct saba. Also denies any obvious fluctuation of symptoms with weather or environmental changes or other aggravating or alleviating factors except as outlined above   Current Medications, Allergies, Complete Past Medical History, Past Surgical History, Family History, and Social History were reviewed in [employer] record.  ROS  The following are not active complaints unless bolded sore throat, dysphagia, dental problems, itching, sneezing,  nasal congestion or excess/ purulent secretions, ear ache,   fever, chills, sweats, unintended wt loss, classically pleuritic or exertional cp, hemoptysis,  orthopnea pnd or leg swelling, presyncope, palpitations, abdominal pain, anorexia, nausea, vomiting, diarrhea  or change in bowel or bladder habits, change in stools or urine, dysuria,hematuria,  rash, arthralgias, visual complaints, headache, numbness, weakness or ataxia or problems with walking or coordination,  change in mood/affect or memory.                          Objective:   Physical Exam     Pleasant  amb wf with  voice    08/12/2016        198  07/15/2016       199  01/16/2016     195  12/05/2015          195   10/24/2015       193   10/05/15 190 lb 9.6 oz (86.456 kg)  12/06/14 202 lb (91.627 kg)  06/21/13 202 lb (  91.627 kg)    Vital signs reviewed - Note on arrival 02 sats  98% on RA     HEENT: nl dentition, turbinates, and oropharynx which is pristine . Nl external ear canals without cough reflex   NECK :  without JVD/Nodes/TM/ nl carotid upstrokes bilaterally   LUNGS: no acc muscle use,  Nl contour chest which is clear to A and P with no cough on insp or exp    CV:  RRR  no s3 or murmur or increase in P2, no edema   ABD:  soft and nontender with nl inspiratory excursion in the supine position. No bruits or organomegaly, bowel sounds nl  MS:  Nl gait/ ext warm without deformities, calf tenderness, cyanosis or clubbing No obvious joint restrictions   SKIN: warm and dry without lesions    NEURO:  alert, approp, nl sensorium with  no motor deficits       CXR PA and Lateral:   07/15/2016 :    I personally reviewed images and agree with radiology impression as follows:   COPD-reactive airway disease. No pneumonia, CHF, nor other acute cardiopulmonary abnormality.                       Assessment:

## 2016-08-12 NOTE — Patient Instructions (Addendum)
Change symbicort to just taking the 2 pffs in am and gargle with baking soda toothpaste  If start worse cough sleeping then restart the symbicort  Please see patient coordinator before you leave today  to schedule sinus CT and if neg the next step for the throat is a medication called gabapentin in very low doses for irritable larynx syndrome   Please schedule a follow up visit in 3 months but call sooner if needed

## 2016-08-19 ENCOUNTER — Ambulatory Visit (INDEPENDENT_AMBULATORY_CARE_PROVIDER_SITE_OTHER)
Admission: RE | Admit: 2016-08-19 | Discharge: 2016-08-19 | Disposition: A | Discharge status: Home / Self Care | Payer: Medicare Other | Source: Ambulatory Visit | Attending: Internal Medicine | Admitting: Internal Medicine

## 2016-08-19 ENCOUNTER — Telehealth: Payer: Self-pay | Admitting: Internal Medicine

## 2016-08-19 DIAGNOSIS — J45991 Cough variant asthma: Secondary | ICD-10-CM

## 2016-08-19 DIAGNOSIS — J341 Cyst and mucocele of nose and nasal sinus: Secondary | ICD-10-CM | POA: Diagnosis not present

## 2016-08-19 MED ORDER — GABAPENTIN 100 MG PO CAPS: ORAL_CAPSULE | ORAL | 0 refills | Status: DC

## 2016-08-19 NOTE — Telephone Encounter (Signed)
Spoke with the pt  Her sinus CT was neg  She is asking about gabapentin rx  Please advise what strength of med and directions, thanks!

## 2016-08-19 NOTE — Telephone Encounter (Signed)
Spoke with pt and made her aware of MW's message. Pt agreed to the medication and the follow up appt. Her rx was sent to her pharmacy and her f/u appt was made. She had no further questions. Nothing further is needed at this time

## 2016-08-19 NOTE — Progress Notes (Signed)
LMTCB

## 2016-08-19 NOTE — Progress Notes (Signed)
Spoke with pt and notified of results per Dr. Wert. Pt verbalized understanding and denied any questions. 

## 2016-08-19 NOTE — Telephone Encounter (Signed)
Ok to try gabapentin 100 mg starting with hs x one week then bid x one week then tid x one week and see me in one month

## 2016-09-04 ENCOUNTER — Telehealth: Payer: Self-pay | Admitting: Internal Medicine

## 2016-09-04 NOTE — Telephone Encounter (Signed)
Spoke with patient. She stated that she was taking one pill in the morning and the other at night. Advised patient to go back to taking just one pill a day either in the morning or evening for a week and see how she feels. Advised her to call her back to report how she was feeling.  She verbalized understanding. Nothing else was needed at time of call.

## 2016-09-04 NOTE — Telephone Encounter (Signed)
Make sure the doses are separated eg take one in am and the other at hs If can't tolerate the two doses then try just one either at hs or in am x one week to see which way is more tolerable/ works better then return with all meds in hand to regroup if neither way she doses is both tolerated and effective

## 2016-09-04 NOTE — Telephone Encounter (Signed)
Since starting with 2 pills. No energy. 2 weeks ago.   Spoke with patient. She stated that she started the Neurontin 100mg  2 weeks ago. She was taking 1 pill daily but is now taking 2 pills daily. Since starting 2 pills, she has been experiencing extreme fatigue. It has helped her cough but she can not tolerate the fatigue.    Change symbicort to just taking the 2 pffs in am and gargle with baking soda toothpaste  If start worse cough sleeping then restart the symbicort  Please see patient coordinator before you leave today to schedule sinus CT and if neg the next step for the throat is a medication called gabapentin in very low doses for irritable larynx syndrome  Please schedule a follow up visit in 3 months but call sooner if needed   MW, please advise.

## 2016-09-23 ENCOUNTER — Encounter: Payer: Self-pay | Admitting: Internal Medicine

## 2016-09-23 ENCOUNTER — Ambulatory Visit (INDEPENDENT_AMBULATORY_CARE_PROVIDER_SITE_OTHER): Payer: Medicare Other | Admitting: Internal Medicine

## 2016-09-23 VITALS — BP 126/74 | HR 70 | Ht 66.5 in | Wt 198.0 lb

## 2016-09-23 DIAGNOSIS — J45991 Cough variant asthma: Secondary | ICD-10-CM | POA: Diagnosis not present

## 2016-09-23 MED ORDER — GABAPENTIN 100 MG PO CAPS: 100.0000 mg | ORAL_CAPSULE | Freq: Three times a day (TID) | ORAL | 11 refills | Status: DC

## 2016-09-23 NOTE — Assessment & Plan Note (Signed)
Body mass index is 31.48 kg/m.  -  No change  Lab Results  Component Value Date   TSH 1.68 10/05/2015     Contributing to gerd risk/ doe/reviewed the need and the process to achieve and maintain neg calorie balance > defer f/u primary care including intermittently monitoring thyroid status

## 2016-09-23 NOTE — Progress Notes (Signed)
Subjective:     Patient ID: Mariah Stevenson, female   DOB: [birth year]    MRN: [id number]     Brief patient profile:  [age] yowf quit smoking in 1995 with a tendency then to recurrent  bronchitis which resolved completely and did fine until winter of 2016 and since then recurrent pattern of episodic cough esp  since winter 2017 but  even between bouts did ok until off maint rx until late spring/early summer 2017    then   needed maint for the first time = symbicort 160 2bid and referred to pulmonary clinic 10/05/2015 by Dr Izola PriceMyers for f/u of abn CT chest from 07/25/15 with a nl baseline cxr at that point with evidence of BOOP on HRCT 10/09/15    History of Present Illness  10/05/2015 1st [facility] office visit/ Margarete Horace   Chief Complaint  Patient presents with  . Pulmonary Consult    Referred by Dr Carlyon ShadowSteven Myers for eval of cough and pulmonary nodules. She c/o cough off and on since Jan 2017. Her cough has been better here recently, and is occ prod with clear sputum.    2 rounds of prednisone this year and did feel better and last used it mid May 2017  New doe = MMRC1 = can walk nl pace, flat grade, can't hurry or go uphills or steps s sob   Mucus clear/ cough since last round of prednisone but throat feels dry/ tight all the time but not while sleeping  rec Try symbicort 80 Take 2 puffs first thing in am and then another 2 puffs about 12 hours later.  Only use your albuterol as a rescue medication  Work on inhaler technique:   Change prilosec to Take 30-60 min before first meal of the day  GERD  Diet    10/24/2015  f/u ov/Shanesha Bednarz re:  boop ? Cough variant asthma?  maint on symb 802bid but poor hfa Chief Complaint  Patient presents with  . Follow-up    Cough had got better, but cough returning this week,dry usually, clear occass.Denies sob or wheezing. Midchest tightness 1 episode.Only used Albuterol 1x in last week  was 100% improved then one week prior noted worse cough/tightness  with heat  humidity rec Plan A = Automatic = Symbicort 80 Take 2 puffs first thing in am and then another 2 puffs about 12 hours later.  Plan B = Backup Only use your albuterol (ventolin) as a rescue medication        01/16/2016  f/u ov/Odilon Cass re: cough variant asthma/ maint rx symb 80 2bid / no need for rescue/ no recent pred Chief Complaint  Patient presents with  . Follow-up    Had her PFT Pt. states breathing is doing good, Using her symbicort as normal, hasn't needed her albuterol   Not limited by breathing from desired activities   rec Pantoprazole (protonix) 40 mg   Take  30-60 min before first meal of the day and when coughing add Pepcid (famotidine)  20 mg one @  bedtime until return to office - this is the best way to tell whether stomach acid is contributing to your problem.   Please remember to go to the lab  department downstairs for your tests - we will call you with the results when they are available. Ok to try off symbiocort to see what change if any you notice and if worse start back on it right away     07/15/2016  f/u ov/Lisvet Rasheed re: ?  boop/ ? Cough variant asthma  Chief Complaint  Patient presents with  . Acute Visit    Was on Symbicort but stop taking it after a few months. Felt fine after the first few weeks, but the cough has returned. Was given a prednisone taper and it helped some but the cough is still here. Mainly a non-productive cough but when it is productive, clear phlegm.    at baseline off all inhalers x months on prilosec before bfast no pepcid  Cough started late dec 2017 / early jan 2018 and started symbicort 80 and maybe 25% better and pred helped briefly then relapsed while still taking symbicort 80  Cough worse p stirs but Does not awaken her prematurely. rec  Prilosec  40 mg   Take  30-60 min before first meal of the day and add back Pepcid (famotidine)  20 mg one @  bedtime until return to office - this is the best way to tell whether stomach acid is  contributing to your problem.  GERD Prednisone 10 mg take  4 each am x 2 days,   2 each am x 2 days,  1 each am x 2 days and stop      08/12/2016  f/u ov/Zoey Gilkeson re: f/u boop/ cough variant asthma on symb 80 2bid/ max gerd rx  Chief Complaint  Patient presents with  . Follow-up    Breathing is doing well today. Her cough is unchanged. No new co's today.    cough is almost all daytime with sensation of globus assoc with nasal congestion  but no excess/ purulent sputum or mucus plugs  - not better p prednisone  Not limited by breathing from desired activities   rec Change symbicort to just taking the 2 pffs in am and gargle with baking soda toothpaste If start worse cough sleeping then restart the symbicort Please see patient coordinator before you leave today  to schedule sinus CT and if neg the next step for the throat is a medication called gabapentin in very low doses for irritable larynx syndrome      09/23/2016  f/u ov/Kena Limon re: cough for a year, best rx is gabapentin but issue was with tolerance  Chief Complaint  Patient presents with  . Follow-up    Breathing is overall doing well. She states her cough seems worse since the last visit. She has not had to use her albuterol inhaler.   presently on symbicort 80 2 q am and never had noct cough or wheeze but started back on symb and stopped gabapentin and cough day > noct dry > wet since then   No obvious day to day or daytime variability or assoc sob  excess/ purulent sputum or mucus plugs or hemoptysis or cp or chest tightness, subjective wheeze or overt sinus or hb symptoms. No unusual exp hx or h/o childhood pna/ asthma or knowledge of premature birth.  Sleeping ok without nocturnal  or early am exacerbation  of respiratory  c/o's or need for noct saba. Also denies any obvious fluctuation of symptoms with weather or environmental changes or other aggravating or alleviating factors except as outlined above   Current Medications,  Allergies, Complete Past Medical History, Past Surgical History, Family History, and Social History were reviewed in [facility] electronic [facility] record.  ROS  The following are not active complaints unless bolded sore throat, dysphagia, dental problems, itching, sneezing,  nasal congestion or excess/ purulent secretions, ear ache,   fever, chills, sweats, unintended wt  loss, classically pleuritic or exertional cp,  orthopnea pnd or leg swelling, presyncope, palpitations, abdominal pain, anorexia, nausea, vomiting, diarrhea  or change in bowel or bladder habits, change in stools or urine, dysuria,hematuria,  rash, arthralgias, visual complaints, headache, numbness, weakness or ataxia or problems with walking or coordination,  change in mood/affect or memory.               Objective:   Physical Exam     Pleasant  amb wf with occ throat clearing   09/23/2016        198  08/12/2016        198  07/15/2016       199  01/16/2016     195  12/05/2015          195   10/24/2015       193   10/05/15 190 lb 9.6 oz (86.456 kg)  12/06/14 202 lb (91.627 kg)  06/21/13 202 lb (91.627 kg)    Vital signs reviewed - Note on arrival 02 sats 100% on RA     HEENT: nl dentition, turbinates, and oropharynx which continues to be  pristine . Nl external ear canals without cough reflex   NECK :  without JVD/Nodes/TM/ nl carotid upstrokes bilaterally   LUNGS: no acc muscle use,  Nl contour chest which is clear to A and P with no cough on insp or exp    CV:  RRR  no s3 or murmur or increase in P2, no edema   ABD:  soft and nontender with nl inspiratory excursion in the supine position. No bruits or organomegaly, bowel sounds nl  MS:  Nl gait/ ext warm without deformities, calf tenderness, cyanosis or clubbing No obvious joint restrictions   SKIN: warm and dry without lesions    NEURO:  alert, approp, nl sensorium with  no motor deficits                         Assessment:

## 2016-09-23 NOTE — Assessment & Plan Note (Signed)
-   Spirometry 10/05/2015  FEV1 2.00 (81%)  Ratio 88 p am symb 160 > change to 80 2bid   - 10/24/2015  After extensive coaching HFA effectiveness =    75% from a baseline of 25% - Allergy profile 01/16/16 >  Eos 0.0/  IgE  67 neg RAST  - PFT's 01/16/2016 no airflow obstruction  - Sinus CT 08/19/2016 >>> Unchanged small left sphenoid sinus mucous retention cyst, otherwise clear sinuses. - gabapentin 100 tid 08/19/2016 >>> improved but  Could not tol 09/23/2016 so rec bid or 2 hs dosing    Did not follow instructions re symb rx and now clear at this point whether the symbicort is a good idea as might be part of her problem as worse since increased symbicort and stopped gabapentin  Discussed in detail all the  indications, usual  risks and alternatives  relative to the benefits with patient who agrees to proceed with rechallenge with gabapentin and reduce symb to 80 x 2 each am and improve again try off symb and do mct one week later.  If not better still needs mct then [facility]  I had an extended discussion with the patient reviewing all relevant studies completed to date and  lasting 15 to 20 minutes of a 25 minute visit    Each maintenance medication was reviewed in detail including most importantly the difference between maintenance and prns and under what circumstances the prns are to be triggered using an action plan format that is not reflected in the computer generated alphabetically organized AVS.    Please see AVS for specific instructions unique to this visit that I personally wrote and verbalized to the the pt in detail and then reviewed with pt  by my nurse highlighting any  changes in therapy recommended at today's visit to their plan of care.

## 2016-09-23 NOTE — Patient Instructions (Addendum)
Change symbicort to just taking the 2 pffs in am and gargle with baking soda toothpaste  If start worse cough sleeping then restart the symbicort in evening   Restart gabapentin 100  Mg each am am x one week then increase to am/evening  Dosing  Or take both at bedtime if can't tolerate it any other way   Please schedule a follow up visit in 3 months but call sooner if needed  - need MCT then refer to Dr Delford FieldWright

## 2016-09-24 DIAGNOSIS — J203 Acute bronchitis due to coxsackievirus: Secondary | ICD-10-CM | POA: Diagnosis not present

## 2016-10-11 DIAGNOSIS — S81802A Unspecified open wound, left lower leg, initial encounter: Secondary | ICD-10-CM | POA: Diagnosis not present

## 2016-11-12 ENCOUNTER — Ambulatory Visit: Payer: Medicare Other | Admitting: Internal Medicine

## 2016-12-24 ENCOUNTER — Encounter: Payer: Self-pay | Admitting: Internal Medicine

## 2016-12-24 ENCOUNTER — Ambulatory Visit (INDEPENDENT_AMBULATORY_CARE_PROVIDER_SITE_OTHER): Payer: Medicare Other | Admitting: Internal Medicine

## 2016-12-24 VITALS — BP 116/80 | HR 67 | Ht 66.5 in | Wt 203.0 lb

## 2016-12-24 DIAGNOSIS — J45991 Cough variant asthma: Secondary | ICD-10-CM

## 2016-12-24 DIAGNOSIS — Z23 Encounter for immunization: Secondary | ICD-10-CM | POA: Diagnosis not present

## 2016-12-24 NOTE — Progress Notes (Signed)
Subjective:     Patient ID: Mariah Stevenson, female   DOB: Apr 07, 1945    MRN: [id number]     Brief patient profile:  86 yowf quit smoking in 1995 with a tendency then to recurrent  bronchitis which resolved completely and did fine until winter of 2016 and since then recurrent pattern of episodic cough esp  since winter 2017 but  even between bouts did ok until off maint rx until late spring/early summer 2017    then   needed maint for the first time = symbicort 160 2bid and referred to pulmonary clinic 10/05/2015 by Dr Izola Price for f/u of abn CT chest from 07/25/15 with a nl baseline cxr at that point with evidence of BOOP on HRCT 10/09/15    History of Present Illness  10/05/2015 1st [facility] office visit/ Coady Train   Chief Complaint  Patient presents with  . Pulmonary Consult    Referred by Dr Carlyon Shadow for eval of cough and pulmonary nodules. She c/o cough off and on since Jan 2017. Her cough has been better here recently, and is occ prod with clear sputum.    2 rounds of prednisone this year and did feel better and last used it mid May 2017  New doe = MMRC1 = can walk nl pace, flat grade, can't hurry or go uphills or steps s sob   Mucus clear/ cough since last round of prednisone but throat feels dry/ tight all the time but not while sleeping  rec Try symbicort 80 Take 2 puffs first thing in am and then another 2 puffs about 12 hours later.  Only use your albuterol as a rescue medication  Work on inhaler technique:   Change prilosec to Take 30-60 min before first meal of the day  GERD  Diet    10/24/2015  f/u ov/Ethan Kasperski re:  boop ? Cough variant asthma?  maint on symb 802bid but poor hfa Chief Complaint  Patient presents with  . Follow-up    Cough had got better, but cough returning this week,dry usually, clear occass.Denies sob or wheezing. Midchest tightness 1 episode.Only used Albuterol 1x in last week  was 100% improved then one week prior noted worse cough/tightness  with heat  humidity rec Plan A = Automatic = Symbicort 80 Take 2 puffs first thing in am and then another 2 puffs about 12 hours later.  Plan B = Backup Only use your albuterol (ventolin) as a rescue medication      01/16/2016  f/u ov/Daquarius Dubeau re: cough variant asthma/ maint rx symb 80 2bid / no need for rescue/ no recent pred Chief Complaint  Patient presents with  . Follow-up    Had her PFT Pt. states breathing is doing good, Using her symbicort as normal, hasn't needed her albuterol   Not limited by breathing from desired activities   rec Pantoprazole (protonix) 40 mg   Take  30-60 min before first meal of the day and when coughing add Pepcid (famotidine)  20 mg one @  bedtime until return to office - this is the best way to tell whether stomach acid is contributing to your problem.   Please remember to go to the lab  department downstairs for your tests - we will call you with the results when they are available. Ok to try off symbiocort to see what change if any you notice and if worse start back on it right away     07/15/2016  f/u ov/Ralyn Stlaurent re: ? boop/ ?  Cough variant asthma  Chief Complaint  Patient presents with  . Acute Visit    Was on Symbicort but stop taking it after a few months. Felt fine after the first few weeks, but the cough has returned. Was given a prednisone taper and it helped some but the cough is still here. Mainly a non-productive cough but when it is productive, clear phlegm.    at baseline off all inhalers x months on prilosec before bfast no pepcid  Cough started late dec 2017 / early jan 2018 and started symbicort 80 and maybe 25% better and pred helped briefly then relapsed while still taking symbicort 80  Cough worse p stirs but Does not awaken her prematurely. rec  Prilosec  40 mg   Take  30-60 min before first meal of the day and add back Pepcid (famotidine)  20 mg one @  bedtime until return to office - this is the best way to tell whether stomach acid is contributing  to your problem.  GERD Prednisone 10 mg take  4 each am x 2 days,   2 each am x 2 days,  1 each am x 2 days and stop      08/12/2016  f/u ov/Lillyana Majette re: f/u boop/ cough variant asthma on symb 80 2bid/ max gerd rx  Chief Complaint  Patient presents with  . Follow-up    Breathing is doing well today. Her cough is unchanged. No new co's today.    cough is almost all daytime with sensation of globus assoc with nasal congestion  but no excess/ purulent sputum or mucus plugs  - not better p prednisone  Not limited by breathing from desired activities   rec Change symbicort to just taking the 2 pffs in am and gargle with baking soda toothpaste If start worse cough sleeping then restart the symbicort Please see patient coordinator before you leave today  to schedule sinus CT and if neg the next step for the throat is a medication called gabapentin in very low doses for irritable larynx syndrome      09/23/2016  f/u ov/Aireal Slater re: cough for a year, best rx is gabapentin but issue was with tolerance  Chief Complaint  Patient presents with  . Follow-up    Breathing is overall doing well. She states her cough seems worse since the last visit. She has not had to use her albuterol inhaler.   presently on symbicort 80 2 q am and never had noct cough or wheeze but started back on symb and stopped gabapentin and cough day > noct dry > wet since then  rec Change symbicort to just taking the 2 pffs in am and gargle with baking soda toothpaste If start worse cough sleeping then restart the symbicort in evening  Restart gabapentin 100  Mg each am am x one week then increase to am/evening  Dosing  Or take both at bedtime if can't tolerate it any other way    12/24/2016  f/u ov/Neveyah Garzon re: prob uacs with cough since winter 2016, best rx to date = gabapentin Chief Complaint  Patient presents with  . Follow-up    Cough is much improved, and is most of the time non prod. No new co's. She has not needed rescue inhaler.    taking gabapentin 100 x 2 each am (instructions were to take 2 hs) and helps cough but feeling fuzzy on higher doses No noct cough on just symb 80 each am and Not limited by breathing  from desired activities   Now on fosfamax s flare on gerd rx also   No obvious day to day or daytime variability or assoc excess/ purulent sputum or mucus plugs or hemoptysis or cp or chest tightness, subjective wheeze or overt sinus or hb symptoms. No unusual exp hx or h/o childhood pna/ asthma or knowledge of premature birth.  Sleeping ok flat without nocturnal  or early am exacerbation  of respiratory  c/o's or need for noct saba. Also denies any obvious fluctuation of symptoms with weather or environmental changes or other aggravating or alleviating factors except as outlined above   Current Allergies, Complete Past Medical History, Past Surgical History, Family History, and Social History were reviewed in [employer] record.  ROS  The following are not active complaints unless bolded sore throat, dysphagia, dental problems, itching, sneezing,  nasal congestion or disharge of excess mucus or purulent secretions, ear ache,   fever, chills, sweats, unintended wt loss or wt gain, classically pleuritic or exertional cp,  orthopnea pnd or leg swelling, presyncope, palpitations, abdominal pain, anorexia, nausea, vomiting, diarrhea  or change in bowel habits or bladder habits, change in stools or change in urine, dysuria, hematuria,  rash, arthralgias, visual complaints, headache, numbness, weakness or ataxia or problems with walking or coordination,  change in mood/affect or memory.        Current Meds  Medication Sig  . albuterol (VENTOLIN HFA) 108 (90 Base) MCG/ACT inhaler Inhale 2 puffs into the lungs every 6 (six) hours as needed for wheezing or shortness of breath.  Marland Kitchen alendronate (FOSAMAX) 70 MG tablet Take 1 tablet by mouth every 7 (seven) days.  Marland Kitchen aspirin 81 MG tablet Take 81 mg by mouth  daily.  Marland Kitchen atorvastatin (LIPITOR) 20 MG tablet Take 1 tablet by mouth daily.  Marland Kitchen BIOTIN PO Take 1 capsule by mouth daily.  . budesonide-formoterol (SYMBICORT) 80-4.5 MCG/ACT inhaler Inhale 2 puffs into the lungs 2 (two) times daily.  . clopidogrel (PLAVIX) 75 MG tablet Take 75 mg by mouth daily.  Marland Kitchen FLUoxetine (PROZAC) 20 MG capsule Take 40 mg by mouth daily.  . fluticasone (FLONASE) 50 MCG/ACT nasal spray Place 2 sprays into both nostrils daily.  Marland Kitchen gabapentin (NEURONTIN) 100 MG capsule Take 200 mg by mouth daily.  . Melatonin 10 MG CAPS Take 1 Can by mouth daily.  . Multiple Vitamin (MULTIVITAMIN) capsule Take 1 capsule by mouth daily.  Marland Kitchen omeprazole (PRILOSEC) 40 MG capsule Take 40 mg by mouth daily.  . [DISCONTINUED] gabapentin (NEURONTIN) 100 MG capsule Taking 2 each am                   Objective:   Physical Exam     Pleasant  amb wf with rare  throat clearing  12/24/2016        203  09/23/2016        198  08/12/2016        198  07/15/2016       199  01/16/2016     195  12/05/2015          195   10/24/2015       193   10/05/15 190 lb 9.6 oz (86.456 kg)  12/06/14 202 lb (91.627 kg)  06/21/13 202 lb (91.627 kg)    Vital signs reviewed - Note on arrival 02 sats 100% on RA     HEENT: nl dentition, turbinates, and oropharynx which continues to be  pristine . Nl external  ear canals without cough reflex   NECK :  without JVD/Nodes/TM/ nl carotid upstrokes bilaterally   LUNGS: no acc muscle use,  Nl contour chest which is clear to A and P with no cough on insp or exp    CV:  RRR  no s3 or murmur or increase in P2, no edema   ABD:  soft and nontender with nl inspiratory excursion in the supine position. No bruits or organomegaly, bowel sounds nl  MS:  Nl gait/ ext warm without deformities, calf tenderness, cyanosis or clubbing No obvious joint restrictions   SKIN: warm and dry without lesions    NEURO:  alert, approp, nl sensorium with  no motor deficits                          Assessment:

## 2016-12-24 NOTE — Patient Instructions (Addendum)
Change gabapentin to one in am and one at bedtime to see what difference if any this makes in your cough or fuzziness   Try off symbicort but restart if cough flares    Please schedule a follow up visit in 3 months but call sooner if needed

## 2016-12-24 NOTE — Assessment & Plan Note (Addendum)
-   Spirometry 10/05/2015  FEV1 2.00 (81%)  Ratio 88 p am symb 160 > change to 80 2bid   - 10/24/2015  After extensive coaching HFA effectiveness =    75% from a baseline of 25% - Allergy profile 01/16/16 >  Eos 0.0/  IgE  67 neg RAST  - PFT's 01/16/2016 no airflow obstruction  - Sinus CT 08/19/2016 >>> Unchanged small left sphenoid sinus mucous retention cyst, otherwise clear sinuses. - gabapentin 100 tid 08/19/2016 >>> improved but  Could not tol 09/23/2016 so rec bid or 2 hs dosing - added fosfamax late spring / early summer 2018   - 12/24/2016  trial off symbicort and continue gabapentin 100 bid (instead of 2 q am)   I had an extended discussion with the patient reviewing all relevant studies completed to date and  lasting 10   minutes of a 15 minute visit    Each maintenance medication was reviewed in detail including most importantly the difference between maintenance and prns and under what circumstances the prns are to be triggered using an action plan format that is not reflected in the computer generated alphabetically organized AVS.    Please see AVS for specific instructions unique to this visit that I personally wrote and verbalized to the the pt in detail and then reviewed with pt  by my nurse highlighting any  changes in therapy recommended at today's visit to their plan of care.

## 2016-12-30 DIAGNOSIS — H524 Presbyopia: Secondary | ICD-10-CM | POA: Diagnosis not present

## 2016-12-30 DIAGNOSIS — Z961 Presence of intraocular lens: Secondary | ICD-10-CM | POA: Diagnosis not present

## 2016-12-30 DIAGNOSIS — H52222 Regular astigmatism, left eye: Secondary | ICD-10-CM | POA: Diagnosis not present

## 2016-12-30 DIAGNOSIS — H35371 Puckering of macula, right eye: Secondary | ICD-10-CM | POA: Diagnosis not present

## 2017-01-09 ENCOUNTER — Ambulatory Visit: Payer: Medicare Other | Admitting: Acute Care

## 2017-01-13 ENCOUNTER — Encounter: Payer: Self-pay | Admitting: Internal Medicine

## 2017-01-13 ENCOUNTER — Ambulatory Visit (INDEPENDENT_AMBULATORY_CARE_PROVIDER_SITE_OTHER): Payer: Medicare Other | Admitting: Internal Medicine

## 2017-01-13 VITALS — BP 124/90 | HR 79 | Temp 97.8°F | Ht 66.5 in | Wt 203.8 lb

## 2017-01-13 DIAGNOSIS — E669 Obesity, unspecified: Secondary | ICD-10-CM | POA: Diagnosis not present

## 2017-01-13 DIAGNOSIS — J45991 Cough variant asthma: Secondary | ICD-10-CM

## 2017-01-13 MED ORDER — FAMOTIDINE 20 MG PO TABS: ORAL_TABLET | ORAL | Status: AC

## 2017-01-13 NOTE — Assessment & Plan Note (Signed)
-   Spirometry 10/05/2015  FEV1 2.00 (81%)  Ratio 88 p am symb 160 > change to 80 2bid  - Allergy profile 01/16/16 >  Eos 0.0/  IgE  67 neg RAST  - PFT's 01/16/2016 no airflow obstruction  - Sinus CT 08/19/2016 >>> Unchanged small left sphenoid sinus mucous retention cyst, otherwise clear sinuses. - gabapentin 100 tid 08/19/2016 >>> improved but  Could not tol 09/23/2016 so rec bid or 2 hs dosing - added fosfamax late spring / early summer 2018  - 12/24/2016 trial off symbicort and continue gabapentin 100 bid (instead of 2 q am) > worse w/in  One week  - 01/13/2017 added h1 and d/c fosfamax - 01/13/2017  After extensive coaching HFA effectiveness =    90%    Def worse off ics but hard go be sure about cause and effect and now on biphosphonates raising the issue again of Upper airway cough syndrome (previously labeled PNDS),  is so named because it's frequently impossible to sort out how much is  CR/sinusitis with freq throat clearing (which can be related to primary GERD)   vs  causing  secondary (" extra esophageal")  GERD from wide swings in gastric pressure that occur with throat clearing, often  promoting self use of mint and menthol lozenges that reduce the lower esophageal sphincter tone and exacerbate the problem further in a cyclical fashion.   These are the same pts (now being labeled as having "irritable larynx syndrome" by some cough centers) who not infrequently have a history of having failed to tolerate ace inhibitors,  dry powder inhalers or biphosphonates or report having atypical/extraesophageal reflux symptoms that don't respond to standard doses of PPI  and are easily confused as having aecopd or asthma flares by even experienced allergists/ pulmonologists (myself included).  To sort this out rec: Try off fosfamax x 6 weeks and if need to rx osteo > Reclast IV vs prolia injections/ defer to Dr Lenise Arena though we could arrange here if preferred   Continue max rx for gerd  Continue symb  80 2bid and add 1st gen H1 blockers per guidelines     F/u in 6 weeks   I had an extended discussion with the patient reviewing all relevant studies completed to date and  lasting 15 to 20 minutes of a 25 minute acute office visit    Each maintenance medication was reviewed in detail including most importantly the difference between maintenance and prns and under what circumstances the prns are to be triggered using an action plan format that is not reflected in the computer generated alphabetically organized AVS.    Please see AVS for specific instructions unique to this visit that I personally wrote and verbalized to the the pt in detail and then reviewed with pt  by my nurse highlighting any  changes in therapy recommended at today's visit to their plan of care.

## 2017-01-13 NOTE — Assessment & Plan Note (Signed)
Body mass index is 32.4 kg/m.  -  trending no change  Lab Results  Component Value Date   TSH 1.68 10/05/2015     Contributing to gerd risk/ doe/reviewed the need and the process to achieve and maintain neg calorie balance > defer f/u primary care including intermittently monitoring thyroid status

## 2017-01-13 NOTE — Patient Instructions (Addendum)
Continue prilosec 40 mg  Take 30-60 min before first meal of the day and pepcid 20 mg at bedtime   Hold fosfamax and consider RECLAST once yearly instead  For drainage / throat tickle try take CHLORPHENIRAMINE  4 mg - take one every 4 hours as needed - available over the counter- may cause drowsiness so start with just a bedtime dose(one hour before bed)  or two and see how you tolerate it before trying in daytime     Please schedule a follow up office visit in 6 weeks, call sooner if needed (change from previous)

## 2017-01-13 NOTE — Progress Notes (Signed)
Subjective:     Patient ID: TANETTE CHAUCA, female   DOB: 09-14-1945    MRN: [id number]     Brief patient profile:  29 yowf quit smoking in 1995 with a tendency then to recurrent  bronchitis which resolved completely and did fine until winter of 2016 and since then recurrent pattern of episodic cough esp  since winter 2017 but  even between bouts did ok until off maint rx until late spring/early summer 2017    then   needed maint for the first time = symbicort 160 2bid and referred to pulmonary clinic 10/05/2015 by Dr Izola Price for f/u of abn CT chest from 07/25/15 with a nl baseline cxr at that point with evidence of BOOP on HRCT 10/09/15    History of Present Illness  10/05/2015 1st [facility] office visit/ Jefferey Lippmann   Chief Complaint  Patient presents with  . Pulmonary Consult    Referred by Dr Carlyon Shadow for eval of cough and pulmonary nodules. She c/o cough off and on since Jan 2017. Her cough has been better here recently, and is occ prod with clear sputum.    2 rounds of prednisone this year and did feel better and last used it mid May 2017  New doe = MMRC1 = can walk nl pace, flat grade, can't hurry or go uphills or steps s sob   Mucus clear/ cough since last round of prednisone but throat feels dry/ tight all the time but not while sleeping  rec Try symbicort 80 Take 2 puffs first thing in am and then another 2 puffs about 12 hours later.  Only use your albuterol as a rescue medication  Work on inhaler technique:   Change prilosec to Take 30-60 min before first meal of the day  GERD  Diet    10/24/2015  f/u ov/Deserie Dirks re:  boop ? Cough variant asthma?  maint on symb 802bid but poor hfa Chief Complaint  Patient presents with  . Follow-up    Cough had got better, but cough returning this week,dry usually, clear occass.Denies sob or wheezing. Midchest tightness 1 episode.Only used Albuterol 1x in last week  was 100% improved then one week prior noted worse cough/tightness  with heat  humidity rec Plan A = Automatic = Symbicort 80 Take 2 puffs first thing in am and then another 2 puffs about 12 hours later.  Plan B = Backup Only use your albuterol (ventolin) as a rescue medication      01/16/2016  f/u ov/Ezella Kell re: cough variant asthma/ maint rx symb 80 2bid / no need for rescue/ no recent pred Chief Complaint  Patient presents with  . Follow-up    Had her PFT Pt. states breathing is doing good, Using her symbicort as normal, hasn't needed her albuterol   Not limited by breathing from desired activities   rec Pantoprazole (protonix) 40 mg   Take  30-60 min before first meal of the day and when coughing add Pepcid (famotidine)  20 mg one @  bedtime until return to office - this is the best way to tell whether stomach acid is contributing to your problem.   Please remember to go to the lab  department downstairs for your tests - we will call you with the results when they are available. Ok to try off symbiocort to see what change if any you notice and if worse start back on it right away     07/15/2016  f/u ov/Serjio Deupree re: ? boop/ ?  Cough variant asthma  Chief Complaint  Patient presents with  . Acute Visit    Was on Symbicort but stop taking it after a few months. Felt fine after the first few weeks, but the cough has returned. Was given a prednisone taper and it helped some but the cough is still here. Mainly a non-productive cough but when it is productive, clear phlegm.    at baseline off all inhalers x months on prilosec before bfast no pepcid  Cough started late dec 2017 / early jan 2018 and started symbicort 80 and maybe 25% better and pred helped briefly then relapsed while still taking symbicort 80  Cough worse p stirs but Does not awaken her prematurely. rec  Prilosec  40 mg   Take  30-60 min before first meal of the day and add back Pepcid (famotidine)  20 mg one @  bedtime until return to office - this is the best way to tell whether stomach acid is contributing  to your problem.  GERD Prednisone 10 mg take  4 each am x 2 days,   2 each am x 2 days,  1 each am x 2 days and stop      08/12/2016  f/u ov/Patty Leitzke re: f/u boop/ cough variant asthma on symb 80 2bid/ max gerd rx  Chief Complaint  Patient presents with  . Follow-up    Breathing is doing well today. Her cough is unchanged. No new co's today.    cough is almost all daytime with sensation of globus assoc with nasal congestion  but no excess/ purulent sputum or mucus plugs  - not better p prednisone  Not limited by breathing from desired activities   rec Change symbicort to just taking the 2 pffs in am and gargle with baking soda toothpaste If start worse cough sleeping then restart the symbicort Please see patient coordinator before you leave today  to schedule sinus CT and if neg the next step for the throat is a medication called gabapentin in very low doses for irritable larynx syndrome      09/23/2016  f/u ov/Audreena Sachdeva re: cough for a year, best rx is gabapentin but issue was with tolerance  Chief Complaint  Patient presents with  . Follow-up    Breathing is overall doing well. She states her cough seems worse since the last visit. She has not had to use her albuterol inhaler.   presently on symbicort 80 2 q am and never had noct cough or wheeze but started back on symb and stopped gabapentin and cough day > noct dry > wet since then  rec Change symbicort to just taking the 2 pffs in am and gargle with baking soda toothpaste If start worse cough sleeping then restart the symbicort in evening  Restart gabapentin 100  Mg each am am x one week then increase to am/evening  Dosing  Or take both at bedtime if can't tolerate it any other way      12/24/2016  f/u ov/Hung Rhinesmith re: prob uacs with cough since winter 2016, best rx to date = gabapentin Chief Complaint  Patient presents with  . Follow-up    Cough is much improved, and is most of the time non prod. No new co's. She has not needed rescue  inhaler.   taking gabapentin 100 x 2 each am (instructions were to take 2 hs) and helps cough but feeling fuzzy on higher doses No noct cough on just symb 80 each am and Not limited  by breathing from desired activities   Now on fosfamax s flare on gerd rx also  rec Change gabapentin to one in am and one at bedtime to see what difference if any this makes in your cough or fuzziness  Try off symbicort but restart if cough flares  Please schedule a follow up visit in 3 months but call sooner if needed     01/13/2017 acute ov/Laster Appling re: recurrent cough  Chief Complaint  Patient presents with  . Acute Visit    Started coughing 01/02/17 so she started back on Symbicort and this has helped some. She is coughing up some minimal grey sputum.  She has not had to use her rescue inhaler.    On gabapentin as instructed and cough started to get worse about a week p  Stopped symb then restarted it  And some better  Takes the fosfamax typically of fridays but overall thinks cough may be worse vs before started despite ppi qam /h2hs Cough is sporadic/ does wake her up most nocts but not typically in am    No obvious day to day or daytime variability or assoc   mucus plugs or hemoptysis or cp or chest tightness, subjective wheeze or overt sinus or hb symptoms. No unusual exp hx or h/o childhood pna/ asthma or knowledge of premature birth.  Sleeping ok flat without nocturnal  or early am exacerbation  of respiratory  c/o's or need for noct saba. Also denies any obvious fluctuation of symptoms with weather or environmental changes or other aggravating or alleviating factors except as outlined above   Current Allergies, Complete Past Medical History, Past Surgical History, Family History, and Social History were reviewed in [employer] record.  ROS  The following are not active complaints unless bolded Hoarseness, sore throat, dysphagia, dental problems, itching, sneezing,  nasal  congestion or discharge of excess mucus or purulent secretions, ear ache,   fever, chills, sweats, unintended wt loss or wt gain, classically pleuritic or exertional cp,  orthopnea pnd or leg swelling, presyncope, palpitations, abdominal pain, anorexia, nausea, vomiting, diarrhea  or change in bowel habits or change in bladder habits, change in stools or change in urine, dysuria, hematuria,  rash, arthralgias, visual complaints, headache, numbness, weakness or ataxia or problems with walking or coordination,  change in mood/affect or memory.        Current Meds  Medication Sig  . albuterol (VENTOLIN HFA) 108 (90 Base) MCG/ACT inhaler Inhale 2 puffs into the lungs every 6 (six) hours as needed for wheezing or shortness of breath.  Marland Kitchen alendronate (FOSAMAX) 70 MG tablet Take 1 tablet by mouth every 7 (seven) days.  Marland Kitchen aspirin 81 MG tablet Take 81 mg by mouth daily.  Marland Kitchen atorvastatin (LIPITOR) 20 MG tablet Take 1 tablet by mouth daily.  Marland Kitchen BIOTIN PO Take 1 capsule by mouth daily.  . budesonide-formoterol (SYMBICORT) 80-4.5 MCG/ACT inhaler Inhale 2 puffs into the lungs 2 (two) times daily.  . clopidogrel (PLAVIX) 75 MG tablet Take 75 mg by mouth daily.  Marland Kitchen FLUoxetine (PROZAC) 20 MG capsule Take 40 mg by mouth daily.  . fluticasone (FLONASE) 50 MCG/ACT nasal spray Place 2 sprays into both nostrils daily.  Marland Kitchen gabapentin (NEURONTIN) 100 MG capsule Take 200 mg by mouth daily.  . Melatonin 10 MG CAPS Take 1 Can by mouth daily.  . Multiple Vitamin (MULTIVITAMIN) capsule Take 1 capsule by mouth daily.  Marland Kitchen omeprazole (PRILOSEC) 40 MG capsule Take 40 mg by mouth daily.  Objective:   Physical Exam     Pleasant  amb wf with occ  throat clearing   01/13/2017      203  12/24/2016        203  09/23/2016        198  08/12/2016        198  07/15/2016       199  01/16/2016     195  12/05/2015          195   10/24/2015       193   10/05/15 190 lb 9.6 oz (86.456 kg)  12/06/14 202  lb (91.627 kg)  06/21/13 202 lb (91.627 kg)    Vital signs reviewed - Note on arrival 02 sats 99% on RA     HEENT: nl dentition, and oropharynx which is  pristine . Nl external ear canals without cough reflex - very mild  bilateral non-specific turbinate edema  s secretions   NECK :  without JVD/Nodes/TM/ nl carotid upstrokes bilaterally   LUNGS: no acc muscle use,  Nl contour chest which is clear to A and P with no cough on insp or exp    CV:  RRR  no s3 or murmur or increase in P2, no edema   ABD:  soft and nontender with nl inspiratory excursion in the supine position. No bruits or organomegaly, bowel sounds nl  MS:  Nl gait/ ext warm without deformities, calf tenderness, cyanosis or clubbing No obvious joint restrictions   SKIN: warm and dry without lesions    NEURO:  alert, approp, nl sensorium with  no motor deficits                         Assessment:

## 2017-01-20 DIAGNOSIS — E7800 Pure hypercholesterolemia, unspecified: Secondary | ICD-10-CM | POA: Diagnosis not present

## 2017-01-20 DIAGNOSIS — I700 Atherosclerosis of aorta: Secondary | ICD-10-CM | POA: Diagnosis not present

## 2017-01-20 DIAGNOSIS — I2510 Atherosclerotic heart disease of native coronary artery without angina pectoris: Secondary | ICD-10-CM | POA: Diagnosis not present

## 2017-01-20 DIAGNOSIS — Z79899 Other long term (current) drug therapy: Secondary | ICD-10-CM | POA: Diagnosis not present

## 2017-01-20 DIAGNOSIS — F322 Major depressive disorder, single episode, severe without psychotic features: Secondary | ICD-10-CM | POA: Diagnosis not present

## 2017-01-21 DIAGNOSIS — M810 Age-related osteoporosis without current pathological fracture: Secondary | ICD-10-CM | POA: Diagnosis not present

## 2017-02-07 DIAGNOSIS — F339 Major depressive disorder, recurrent, unspecified: Secondary | ICD-10-CM | POA: Diagnosis not present

## 2017-02-24 ENCOUNTER — Ambulatory Visit: Payer: Medicare Other | Admitting: Internal Medicine

## 2017-02-24 ENCOUNTER — Encounter: Payer: Self-pay | Admitting: Internal Medicine

## 2017-02-24 VITALS — BP 118/80 | HR 80 | Ht 66.0 in | Wt 201.0 lb

## 2017-02-24 DIAGNOSIS — J45991 Cough variant asthma: Secondary | ICD-10-CM

## 2017-02-24 NOTE — Patient Instructions (Addendum)
Change the am symbicort and prilosec and gabapentin first thing in am and eat within an hour  Change the chlorpheniramine 4mg  to take it with supper and go ahead the bedtime dose as well   If not better after a week then resume the symbicort 2 pffs in pm also    Please schedule a follow up office visit in 6 weeks, call sooner if needed  - add consider methacholine challenge next ov if cough still present p increases the symb 80 to max dosing

## 2017-02-24 NOTE — Progress Notes (Signed)
Subjective:     Patient ID: Mariah Stevenson, female   DOB: 09-14-1945    MRN: [id number]     Brief patient profile:  29 yowf quit smoking in 1995 with a tendency then to recurrent  bronchitis which resolved completely and did fine until winter of 2016 and since then recurrent pattern of episodic cough esp  since winter 2017 but  even between bouts did ok until off maint rx until late spring/early summer 2017    then   needed maint for the first time = symbicort 160 2bid and referred to pulmonary clinic 10/05/2015 by Dr Izola Price for f/u of abn CT chest from 07/25/15 with a nl baseline cxr at that point with evidence of BOOP on HRCT 10/09/15    History of Present Illness  10/05/2015 1st [facility] office visit/ Marieanne Marxen   Chief Complaint  Patient presents with  . Pulmonary Consult    Referred by Dr Carlyon Shadow for eval of cough and pulmonary nodules. She c/o cough off and on since Jan 2017. Her cough has been better here recently, and is occ prod with clear sputum.    2 rounds of prednisone this year and did feel better and last used it mid May 2017  New doe = MMRC1 = can walk nl pace, flat grade, can't hurry or go uphills or steps s sob   Mucus clear/ cough since last round of prednisone but throat feels dry/ tight all the time but not while sleeping  rec Try symbicort 80 Take 2 puffs first thing in am and then another 2 puffs about 12 hours later.  Only use your albuterol as a rescue medication  Work on inhaler technique:   Change prilosec to Take 30-60 min before first meal of the day  GERD  Diet    10/24/2015  f/u ov/Zaim Nitta re:  boop ? Cough variant asthma?  maint on symb 802bid but poor hfa Chief Complaint  Patient presents with  . Follow-up    Cough had got better, but cough returning this week,dry usually, clear occass.Denies sob or wheezing. Midchest tightness 1 episode.Only used Albuterol 1x in last week  was 100% improved then one week prior noted worse cough/tightness  with heat  humidity rec Plan A = Automatic = Symbicort 80 Take 2 puffs first thing in am and then another 2 puffs about 12 hours later.  Plan B = Backup Only use your albuterol (ventolin) as a rescue medication      01/16/2016  f/u ov/Lakesia Dahle re: cough variant asthma/ maint rx symb 80 2bid / no need for rescue/ no recent pred Chief Complaint  Patient presents with  . Follow-up    Had her PFT Pt. states breathing is doing good, Using her symbicort as normal, hasn't needed her albuterol   Not limited by breathing from desired activities   rec Pantoprazole (protonix) 40 mg   Take  30-60 min before first meal of the day and when coughing add Pepcid (famotidine)  20 mg one @  bedtime until return to office - this is the best way to tell whether stomach acid is contributing to your problem.   Please remember to go to the lab  department downstairs for your tests - we will call you with the results when they are available. Ok to try off symbiocort to see what change if any you notice and if worse start back on it right away     07/15/2016  f/u ov/Guinevere Stephenson re: ? boop/ ?  Cough variant asthma  Chief Complaint  Patient presents with  . Acute Visit    Was on Symbicort but stop taking it after a few months. Felt fine after the first few weeks, but the cough has returned. Was given a prednisone taper and it helped some but the cough is still here. Mainly a non-productive cough but when it is productive, clear phlegm.    at baseline off all inhalers x months on prilosec before bfast no pepcid  Cough started late dec 2017 / early jan 2018 and started symbicort 80 and maybe 25% better and pred helped briefly then relapsed while still taking symbicort 80  Cough worse p stirs but Does not awaken her prematurely. rec  Prilosec  40 mg   Take  30-60 min before first meal of the day and add back Pepcid (famotidine)  20 mg one @  bedtime until return to office - this is the best way to tell whether stomach acid is contributing  to your problem.  GERD Prednisone 10 mg take  4 each am x 2 days,   2 each am x 2 days,  1 each am x 2 days and stop      08/12/2016  f/u ov/Michaela Shankel re: f/u boop/ cough variant asthma on symb 80 2bid/ max gerd rx  Chief Complaint  Patient presents with  . Follow-up    Breathing is doing well today. Her cough is unchanged. No new co's today.    cough is almost all daytime with sensation of globus assoc with nasal congestion  but no excess/ purulent sputum or mucus plugs  - not better p prednisone  Not limited by breathing from desired activities   rec Change symbicort to just taking the 2 pffs in am and gargle with baking soda toothpaste If start worse cough sleeping then restart the symbicort Please see patient coordinator before you leave today  to schedule sinus CT and if neg the next step for the throat is a medication called gabapentin in very low doses for irritable larynx syndrome      09/23/2016  f/u ov/Joanathan Affeldt re: cough for a year, best rx is gabapentin but issue was with tolerance  Chief Complaint  Patient presents with  . Follow-up    Breathing is overall doing well. She states her cough seems worse since the last visit. She has not had to use her albuterol inhaler.   presently on symbicort 80 2 q am and never had noct cough or wheeze but started back on symb and stopped gabapentin and cough day > noct dry > wet since then  rec Change symbicort to just taking the 2 pffs in am and gargle with baking soda toothpaste If start worse cough sleeping then restart the symbicort in evening  Restart gabapentin 100  Mg each am am x one week then increase to am/evening  Dosing  Or take both at bedtime if can't tolerate it any other way      12/24/2016  f/u ov/Tashawna Thom re: prob uacs with cough since winter 2016, best rx to date = gabapentin Chief Complaint  Patient presents with  . Follow-up    Cough is much improved, and is most of the time non prod. No new co's. She has not needed rescue  inhaler.   taking gabapentin 100 x 2 each am (instructions were to take 2 hs) and helps cough but feeling fuzzy on higher doses No noct cough on just symb 80 each am and Not limited  by breathing from desired activities   Now on fosfamax s flare on gerd rx also  rec Change gabapentin to one in am and one at bedtime to see what difference if any this makes in your cough or fuzziness  Try off symbicort but restart if cough flares  Please schedule a follow up visit in 3 months but call sooner if needed     01/13/2017 acute ov/Krystian Ferrentino re: recurrent cough  Chief Complaint  Patient presents with  . Acute Visit    Started coughing 01/02/17 so she started back on Symbicort and this has helped some. She is coughing up some minimal grey sputum.  She has not had to use her rescue inhaler.    On gabapentin as instructed and cough started to get worse about a week p  Stopped symb then restarted it  And some better  Takes the fosfamax typically of fridays but overall thinks cough may be worse vs before started despite ppi qam /h2hs Cough is sporadic/ does wake her up most nocts but not typically in am  rec Continue prilosec 40 mg  Take 30-60 min before first meal of the day and pepcid 20 mg at bedtime  Hold fosfamax and consider RECLAST once yearly instead For drainage / throat tickle try take CHLORPHENIRAMINE  4 mg - take one every 4 hours as needed - available over the counter- may cause drowsiness so start with just a bedtime dose(one hour before bed)  or two and see how you tolerate it before trying in daytime      02/24/2017  f/u ov/Mayo Faulk re:  Cough which she how reports has not 100% resolved since  2013 /  off fosfamax since last ov Chief Complaint  Patient presents with  . Follow-up    Increased cough for the past 3 wks- feels like she is having some PND.   taking h1 and h2  one of each bedtime and sleeping fine and does not wake her up  Sporadic cough only in am  p stirring but waits one hour  before taking any of her meds and only using  gabapentin 100 bid with   daytime sense of pnds esp p supper  but no excess mucus produced when she does. No candy on hand Not limited by breathing from desired activities    No obvious day to day or daytime variability or assoc excess/ purulent sputum or mucus plugs or hemoptysis or cp or chest tightness, subjective wheeze or overt sinus or hb symptoms. No unusual exposure hx or h/o childhood pna/ asthma or knowledge of premature birth.  Sleeping ok flat without nocturnal  or early am exacerbation  of respiratory  c/o's or need for noct saba. Also denies any obvious fluctuation of symptoms with weather or environmental changes or other aggravating or alleviating factors except as outlined above   Current Allergies, Complete Past Medical History, Past Surgical History, Family History, and Social History were reviewed in [facility] electronic medical record.  ROS  The following are not active complaints unless bolded Hoarseness, sore throat, dysphagia, dental problems, itching, sneezing,  nasal congestion or discharge of excess mucus or purulent secretions, ear ache,   fever, chills, sweats, unintended wt loss or wt gain, classically pleuritic or exertional cp,  orthopnea pnd or leg swelling, presyncope, palpitations, abdominal pain, anorexia, nausea, vomiting, diarrhea  or change in bowel habits or change in bladder habits, change in stools or change in urine, dysuria, hematuria,  rash, arthralgias, visual complaints, headache,  numbness, weakness or ataxia or problems with walking or coordination,  change in mood/affect or memory.        Current Meds  Medication Sig  . albuterol (VENTOLIN HFA) 108 (90 Base) MCG/ACT inhaler Inhale 2 puffs into the lungs every 6 (six) hours as needed for wheezing or shortness of breath.  Marland Kitchen. aspirin 81 MG tablet Take 81 mg by mouth daily.  Marland Kitchen. atorvastatin (LIPITOR) 20 MG tablet Take 1 tablet by mouth daily.  Marland Kitchen. BIOTIN PO  Take 1 capsule by mouth daily.  . budesonide-formoterol (SYMBICORT) 80-4.5 MCG/ACT inhaler Inhale 2 puffs into the lungs daily.   Marland Kitchen. buPROPion (WELLBUTRIN XL) 150 MG 24 hr tablet Take 1 tablet by mouth daily.  . clopidogrel (PLAVIX) 75 MG tablet Take 75 mg by mouth daily.  . famotidine (PEPCID) 20 MG tablet One at bedtime  . FLUoxetine (PROZAC) 20 MG capsule Take 60 mg by mouth daily.   . fluticasone (FLONASE) 50 MCG/ACT nasal spray Place 2 sprays into both nostrils daily.  Marland Kitchen. gabapentin (NEURONTIN) 100 MG capsule Take 200 mg by mouth daily.  . Melatonin 10 MG CAPS Take 1 Can by mouth daily.  . Multiple Vitamin (MULTIVITAMIN) capsule Take 1 capsule by mouth daily.  Marland Kitchen. omeprazole (PRILOSEC) 40 MG capsule Take 40 mg by mouth daily.                        Objective:   Physical Exam     somber amb wf nad   02/24/2017      201  01/13/2017      203  12/24/2016        203  09/23/2016        198  08/12/2016        198  07/15/2016       199  01/16/2016     195  12/05/2015          195   10/24/2015       193   10/05/15 190 lb 9.6 oz (86.456 kg)  12/06/14 202 lb (91.627 kg)  06/21/13 202 lb (91.627 kg)    Vital signs reviewed - Note on arrival 02 sats 98% RA        HEENT: nl dentition, turbinates bilaterally, and oropharynx which is s cobblestoning or any obvious pnd. Nl external ear canals without cough reflex   NECK :  without JVD/Nodes/TM/ nl carotid upstrokes bilaterally   LUNGS: no acc muscle use,  Nl contour chest which is clear to A and P bilaterally without cough on insp or exp maneuvers   CV:  RRR  no s3 or murmur or increase in P2, and no edema   ABD:  soft and nontender with nl inspiratory excursion in the supine position. No bruits or organomegaly appreciated, bowel sounds nl  MS:  Nl gait/ ext warm without deformities, calf tenderness, cyanosis or clubbing No obvious joint restrictions   SKIN: warm and dry without lesions    NEURO:  alert, approp, nl  sensorium with  no motor or cerebellar deficits apparent.                  Assessment:

## 2017-02-25 ENCOUNTER — Encounter: Payer: Self-pay | Admitting: Internal Medicine

## 2017-02-25 NOTE — Assessment & Plan Note (Signed)
-   Spirometry 10/05/2015  FEV1 2.00 (81%)  Ratio 88 p am symb 160 > change to 80 2bid  - Allergy profile 01/16/16 >  Eos 0.0/  IgE  67 neg RAST  - PFT's 01/16/2016 no airflow obstruction  - Sinus CT 08/19/2016 >>> Unchanged small left sphenoid sinus mucous retention cyst, otherwise clear sinuses. - gabapentin 100 tid 08/19/2016 >>> improved but  Could not tol 09/23/2016 so rec bid or 2 hs dosing - added fosfamax late spring / early summer 2018  - 12/24/2016 trial off symbicort and continue gabapentin 100 bid (instead of 2 q am) > worse w/in  One week - 01/13/2017 added h1 and d/c'ed  fosfamax - 01/13/2017  After extensive coaching HFA effectiveness =    90%   Exceptionally challenging pt with hx that changes at each ov in terms of when the started cough really started and even in terms of daily pattern for cough - she tends to add lib between ov's in terms of starting and stopping meds also (reduced symbicort).   Reviewed with pt: The standardized cough guidelines published in Chest by [employer] in 2006 are still the best available and consist of a multiple step process (up to 12!) , not a single office visit,  and are intended  to address this problem logically,  with an alogrithm dependent on response to empiric treatment at  each progressive step  to determine a specific diagnosis with  minimal addtional testing needed. Therefore if adherence is an issue or can't be accurately verified,  it's very unlikely the standard evaluation and treatment will be successful here.    Furthermore, response to therapy (other than acute cough suppression, which should only be used short term with avoidance of narcotic containing cough syrups if possible), can be a gradual process for which the patient is not likely to  perceive immediate benefit.  Unlike going to an eye doctor where the best perscription is almost always the first one and is immediately effective, this is almost never the case in the management  of chronic cough syndromes. Therefore the patient needs to commit up front to consistently adhere to recommendations  for up to 6 weeks of therapy directed at the likely underlying problem(s) before the response can be reasonably evaluated.   Will try adding h1 p supper and if not effective then resume symbicort 80 2bid in that order Also emphasized "first thing in am" does not mean one hour p stirring around.   I had an extended discussion with the patient reviewing all relevant studies completed to date and  lasting 15 to 20 minutes of a 25 minute visit re:  Discussed in detail all the  indications, usual  risks and alternatives  relative to the benefits with patient who agrees to proceed with rx as outlined.     Each maintenance medication was reviewed in detail including most importantly the difference between maintenance and prns and under what circumstances the prns are to be triggered using an action plan format that is not reflected in the computer generated alphabetically organized AVS.    Please see AVS for specific instructions unique to this visit that I personally wrote and verbalized to the the pt in detail and then reviewed with pt  by my nurse highlighting any  changes in therapy recommended at today's visit to their plan of care.

## 2017-03-27 ENCOUNTER — Ambulatory Visit: Payer: Medicare Other | Admitting: Internal Medicine

## 2017-04-07 ENCOUNTER — Ambulatory Visit: Payer: Medicare Other | Admitting: Internal Medicine

## 2017-04-07 ENCOUNTER — Encounter: Payer: Self-pay | Admitting: Internal Medicine

## 2017-04-07 ENCOUNTER — Telehealth: Payer: Self-pay | Admitting: *Deleted

## 2017-04-07 VITALS — BP 122/68 | HR 86 | Ht 66.0 in | Wt 203.6 lb

## 2017-04-07 DIAGNOSIS — J45991 Cough variant asthma: Secondary | ICD-10-CM | POA: Diagnosis not present

## 2017-04-07 NOTE — Patient Instructions (Addendum)
Add gabapentin 100 mg and chlorpheniramine 4 mg at bedtime  Please schedule a follow up visit in 3 months but call sooner if needed  Late add: correction:  The gabapentin and chlorpheniramine were supposed to be supper time daily (she already takes hs doses)

## 2017-04-07 NOTE — Progress Notes (Signed)
Subjective:     Patient ID: Mariah Stevenson, female   DOB: 09-14-1945    MRN: [id number]     Brief patient profile:  29 yowf quit smoking in 1995 with a tendency then to recurrent  bronchitis which resolved completely and did fine until winter of 2016 and since then recurrent pattern of episodic cough esp  since winter 2017 but  even between bouts did ok until off maint rx until late spring/early summer 2017    then   needed maint for the first time = symbicort 160 2bid and referred to pulmonary clinic 10/05/2015 by Dr Izola Price for f/u of abn CT chest from 07/25/15 with a nl baseline cxr at that point with evidence of BOOP on HRCT 10/09/15    History of Present Illness  10/05/2015 1st [facility] office visit/ Mariah Stevenson   Chief Complaint  Patient presents with  . Pulmonary Consult    Referred by Dr Carlyon Shadow for eval of cough and pulmonary nodules. She c/o cough off and on since Jan 2017. Her cough has been better here recently, and is occ prod with clear sputum.    2 rounds of prednisone this year and did feel better and last used it mid May 2017  New doe = MMRC1 = can walk nl pace, flat grade, can't hurry or go uphills or steps s sob   Mucus clear/ cough since last round of prednisone but throat feels dry/ tight all the time but not while sleeping  rec Try symbicort 80 Take 2 puffs first thing in am and then another 2 puffs about 12 hours later.  Only use your albuterol as a rescue medication  Work on inhaler technique:   Change prilosec to Take 30-60 min before first meal of the day  GERD  Diet    10/24/2015  f/u ov/Mariah Stevenson re:  boop ? Cough variant asthma?  maint on symb 802bid but poor hfa Chief Complaint  Patient presents with  . Follow-up    Cough had got better, but cough returning this week,dry usually, clear occass.Denies sob or wheezing. Midchest tightness 1 episode.Only used Albuterol 1x in last week  was 100% improved then one week prior noted worse cough/tightness  with heat  humidity rec Plan A = Automatic = Symbicort 80 Take 2 puffs first thing in am and then another 2 puffs about 12 hours later.  Plan B = Backup Only use your albuterol (ventolin) as a rescue medication      01/16/2016  f/u ov/Mariah Stevenson re: cough variant asthma/ maint rx symb 80 2bid / no need for rescue/ no recent pred Chief Complaint  Patient presents with  . Follow-up    Had her PFT Pt. states breathing is doing good, Using her symbicort as normal, hasn't needed her albuterol   Not limited by breathing from desired activities   rec Pantoprazole (protonix) 40 mg   Take  30-60 min before first meal of the day and when coughing add Pepcid (famotidine)  20 mg one @  bedtime until return to office - this is the best way to tell whether stomach acid is contributing to your problem.   Please remember to go to the lab  department downstairs for your tests - we will call you with the results when they are available. Ok to try off symbiocort to see what change if any you notice and if worse start back on it right away     07/15/2016  f/u ov/Mariah Stevenson re: ? boop/ ?  Cough variant asthma  Chief Complaint  Patient presents with  . Acute Visit    Was on Symbicort but stop taking it after a few months. Felt fine after the first few weeks, but the cough has returned. Was given a prednisone taper and it helped some but the cough is still here. Mainly a non-productive cough but when it is productive, clear phlegm.    at baseline off all inhalers x months on prilosec before bfast no pepcid  Cough started late dec 2017 / early jan 2018 and started symbicort 80 and maybe 25% better and pred helped briefly then relapsed while still taking symbicort 80  Cough worse p stirs but Does not awaken her prematurely. rec  Prilosec  40 mg   Take  30-60 min before first meal of the day and add back Pepcid (famotidine)  20 mg one @  bedtime until return to office - this is the best way to tell whether stomach acid is contributing  to your problem.  GERD Prednisone 10 mg take  4 each am x 2 days,   2 each am x 2 days,  1 each am x 2 days and stop      08/12/2016  f/u ov/Mariah Stevenson re: f/u boop/ cough variant asthma on symb 80 2bid/ max gerd rx  Chief Complaint  Patient presents with  . Follow-up    Breathing is doing well today. Her cough is unchanged. No new co's today.    cough is almost all daytime with sensation of globus assoc with nasal congestion  but no excess/ purulent sputum or mucus plugs  - not better p prednisone  Not limited by breathing from desired activities   rec Change symbicort to just taking the 2 pffs in am and gargle with baking soda toothpaste If start worse cough sleeping then restart the symbicort Please see patient coordinator before you leave today  to schedule sinus CT and if neg the next step for the throat is a medication called gabapentin in very low doses for irritable larynx syndrome      09/23/2016  f/u ov/Mariah Stevenson re: cough for a year, best rx is gabapentin but issue was with tolerance  Chief Complaint  Patient presents with  . Follow-up    Breathing is overall doing well. She states her cough seems worse since the last visit. She has not had to use her albuterol inhaler.   presently on symbicort 80 2 q am and never had noct cough or wheeze but started back on symb and stopped gabapentin and cough day > noct dry > wet since then  rec Change symbicort to just taking the 2 pffs in am and gargle with baking soda toothpaste If start worse cough sleeping then restart the symbicort in evening  Restart gabapentin 100  Mg each am am x one week then increase to am/evening  Dosing  Or take both at bedtime if can't tolerate it any other way      12/24/2016  f/u ov/Mariah Stevenson re: prob uacs with cough since winter 2016, best rx to date = gabapentin Chief Complaint  Patient presents with  . Follow-up    Cough is much improved, and is most of the time non prod. No new co's. She has not needed rescue  inhaler.   taking gabapentin 100 x 2 each am (instructions were to take 2 hs) and helps cough but feeling fuzzy on higher doses No noct cough on just symb 80 each am and Not limited  by breathing from desired activities   Now on fosfamax s flare on gerd rx also  rec Change gabapentin to one in am and one at bedtime to see what difference if any this makes in your cough or fuzziness  Try off symbicort but restart if cough flares  Please schedule a follow up visit in 3 months but call sooner if needed     01/13/2017 acute ov/Mariah Stevenson re: recurrent cough  Chief Complaint  Patient presents with  . Acute Visit    Started coughing 01/02/17 so she started back on Symbicort and this has helped some. She is coughing up some minimal grey sputum.  She has not had to use her rescue inhaler.    On gabapentin as instructed and cough started to get worse about a week p  Stopped symb then restarted it  And some better  Takes the fosfamax typically of fridays but overall thinks cough may be worse vs before started despite ppi qam /h2hs Cough is sporadic/ does wake her up most nocts but not typically in am  rec Continue prilosec 40 mg  Take 30-60 min before first meal of the day and pepcid 20 mg at bedtime  Hold fosfamax and consider RECLAST once yearly instead For drainage / throat tickle try take CHLORPHENIRAMINE  4 mg - take one every 4 hours as needed - available over the counter- may cause drowsiness so start with just a bedtime dose(one hour before bed)  or two and see how you tolerate it before trying in daytime        02/24/2017  f/u ov/Mariah Stevenson re:  Cough which she how reports has not 100% resolved since  2013 /  off fosfamax since last ov Chief Complaint  Patient presents with  . Follow-up    Increased cough for the past 3 wks- feels like she is having some PND.   taking h1 and h2  one of each bedtime and sleeping fine and does not wake her up  Sporadic cough only in am  p stirring but waits one hour  before taking any of her meds and only using  gabapentin 100 bid with   daytime sense of pnds esp p supper  but no excess mucus produced when she does. No candy on hand rec Change the am symbicort and prilosec and gabapentin first thing in am and eat within an hour Change the chlorpheniramine 4mg  to take it with supper and go ahead the bedtime dose as well  If not better after a week then resume the symbicort 2 pffs in pm also  Please schedule a follow up office visit in 6 weeks, call sooner if needed  - add consider methacholine challenge next ov if cough still present p increases the symb 80 to max dosing    04/07/2017  f/u ov/Mariah Stevenson re:  Cough x at least 2013  Chief Complaint  Patient presents with  . Follow-up    dry cough on and off,sinus headaches  HA with stress, does not wake up with them but notes them over R temple  Not using 1st gen at supper as rec - says hard to remember to take because cough is sporadic (note last ov said each pm p supper)  Taking gabapentin hs and sleeps fine but cough tends to be worse after supper Not limited by breathing from desired activities  But not aerobically active    No obvious day to day or daytime variability or assoc excess/ purulent sputum or mucus  plugs or hemoptysis or cp or chest tightness, subjective wheeze or overt sinus or hb symptoms. No unusual exposure hx or h/o childhood pna/ asthma or knowledge of premature birth.  Sleeping ok flat without nocturnal  or early am exacerbation  of respiratory  c/o's or need for noct saba. Also denies any obvious fluctuation of symptoms with weather or environmental changes or other aggravating or alleviating factors except as outlined above   Current Allergies, Complete Past Medical History, Past Surgical History, Family History, and Social History were reviewed in [employer] record.  ROS  The following are not active complaints unless bolded Hoarseness, sore throat, dysphagia,  dental problems, itching, sneezing,  nasal congestion or discharge of excess mucus or purulent secretions, ear ache,   fever, chills, sweats, unintended wt loss or wt gain, classically pleuritic or exertional cp,  orthopnea pnd or leg swelling, presyncope, palpitations, abdominal pain, anorexia, nausea, vomiting, diarrhea  or change in bowel habits or change in bladder habits, change in stools or change in urine, dysuria, hematuria,  rash, arthralgias, visual complaints, headache, numbness, weakness or ataxia or problems with walking or coordination,  change in mood/affect or memory.        Current Meds  Medication Sig  . albuterol (VENTOLIN HFA) 108 (90 Base) MCG/ACT inhaler Inhale 2 puffs into the lungs every 6 (six) hours as needed for wheezing or shortness of breath.  Marland Kitchen aspirin 81 MG tablet Take 81 mg by mouth daily.  Marland Kitchen atorvastatin (LIPITOR) 20 MG tablet Take 1 tablet by mouth daily.  Marland Kitchen BIOTIN PO Take 1 capsule by mouth daily.  . budesonide-formoterol (SYMBICORT) 80-4.5 MCG/ACT inhaler Inhale 2 puffs into the lungs daily.   Marland Kitchen buPROPion (WELLBUTRIN XL) 150 MG 24 hr tablet Take 1 tablet by mouth daily.  . clopidogrel (PLAVIX) 75 MG tablet Take 75 mg by mouth daily.  . famotidine (PEPCID) 20 MG tablet One at bedtime  . FLUoxetine (PROZAC) 20 MG capsule Take 60 mg by mouth daily.   . fluticasone (FLONASE) 50 MCG/ACT nasal spray Place 2 sprays into both nostrils daily.  Marland Kitchen gabapentin (NEURONTIN) 100 MG capsule Take 200 mg by mouth daily.  . Melatonin 10 MG CAPS Take 1 Can by mouth daily.  . Multiple Vitamin (MULTIVITAMIN) capsule Take 1 capsule by mouth daily.  Marland Kitchen omeprazole (PRILOSEC) 40 MG capsule Take 40 mg by mouth daily.                           Objective:   Physical Exam     amb wf all smiles today   04/07/2017          204  02/24/2017      201  01/13/2017      203  12/24/2016        203  09/23/2016        198  08/12/2016        198  07/15/2016       199  01/16/2016      195  12/05/2015          195   10/24/2015       193   10/05/15 190 lb 9.6 oz (86.456 kg)  12/06/14 202 lb (91.627 kg)  06/21/13 202 lb (91.627 kg)    Vital signs reviewed - Note on arrival 02 sats  97% on RA        HEENT: nl dentition  and oropharynx which is  pristine . Nl external ear canals without cough reflex - mild  bilateral non-specific turbinate edema     NECK :  without JVD/Nodes/TM/ nl carotid upstrokes bilaterally   LUNGS: no acc muscle use,  Nl contour chest which is clear to A and P bilaterally without cough on insp or exp maneuvers   CV:  RRR  no s3 or murmur or increase in P2, and no edema   ABD:  soft and nontender with nl inspiratory excursion in the supine position. No bruits or organomegaly appreciated, bowel sounds nl  MS:  Nl gait/ ext warm without deformities, calf tenderness, cyanosis or clubbing No obvious joint restrictions   SKIN: warm and dry without lesions    NEURO:  alert, approp, nl sensorium with  no motor or cerebellar deficits apparent.             Assessment:

## 2017-04-07 NOTE — Telephone Encounter (Signed)
-----   Message from Nyoka CowdenMichael B Wert, MD sent at 04/07/2017 10:05 AM EST -----  The gabapentin and chlorpheniramine were supposed to be added on to present rx @ supper time daily (hs was listed on instructions but she already takes these at hs )

## 2017-04-07 NOTE — Telephone Encounter (Signed)
Pt aware.

## 2017-04-07 NOTE — Assessment & Plan Note (Signed)
-   Spirometry 10/05/2015  FEV1 2.00 (81%)  Ratio 88 p am symb 160 > change to 80 2bid  - Allergy profile 01/16/16 >  Eos 0.0/  IgE  67 neg RAST  - PFT's 01/16/2016 no airflow obstruction  - Sinus CT 08/19/2016 >>> Unchanged small left sphenoid sinus mucous retention cyst, otherwise clear sinuses. - gabapentin 100 tid 08/19/2016 >>> improved but  Could not tol 09/23/2016 so rec bid or 2 hs dosing - added fosfamax late spring / early summer 2018  - 12/24/2016 trial off symbicort and continue gabapentin 100 bid (instead of 2 q am) > worse w/in  One week - 01/13/2017 added h1 and d/c'ed  fosfamax - 01/13/2017  After extensive coaching HFA effectiveness =    90%   - 04/07/2017 rec rechallenge with gabapentin 100 tid and extra chlorpheniramine at supper   Lack of cough resolution on a verified empirical regimen(which we have yet to accomplish) as she continues not to follow recs consistently) could mean an alternative diagnosis, persistence of the disease state (eg sinusitis or bronchiectasis) , or inadequacy of currently available therapy (eg no medical rx available for non-acid gerd).  She has no evidence at all of sinus dz by hx or exam or recent sinus CT (L sphenoid changes are non -specific and not likely to contribute to pain in R temple) so rec max rx for uacs/ irritable larynx syndrome with 1st gen H1 additional dose at hs and try increase gabapentin to 100 tid if tolerates  I had an extended discussion with the patient reviewing all relevant studies completed to date and  lasting 15 to 20 minutes of a 25 minute visit    Each maintenance medication was reviewed in detail including most importantly the difference between maintenance and prns and under what circumstances the prns are to be triggered using an action plan format that is not reflected in the computer generated alphabetically organized AVS.    Please see AVS for specific instructions unique to this visit that I personally wrote and  verbalized to the the pt in detail and then reviewed with pt  by my nurse highlighting any  changes in therapy recommended at today's visit to their plan of care.

## 2017-04-08 DIAGNOSIS — F339 Major depressive disorder, recurrent, unspecified: Secondary | ICD-10-CM | POA: Diagnosis not present

## 2017-04-08 DIAGNOSIS — G250 Essential tremor: Secondary | ICD-10-CM | POA: Diagnosis not present

## 2017-05-26 DIAGNOSIS — D649 Anemia, unspecified: Secondary | ICD-10-CM | POA: Diagnosis not present

## 2017-05-26 DIAGNOSIS — R5383 Other fatigue: Secondary | ICD-10-CM | POA: Diagnosis not present

## 2017-06-27 DIAGNOSIS — R404 Transient alteration of awareness: Secondary | ICD-10-CM | POA: Diagnosis not present

## 2017-07-07 ENCOUNTER — Encounter: Payer: Self-pay | Admitting: Internal Medicine

## 2017-07-07 ENCOUNTER — Ambulatory Visit: Payer: Medicare Other | Admitting: Internal Medicine

## 2017-07-07 VITALS — BP 120/82 | HR 80 | Ht 66.0 in | Wt 195.4 lb

## 2017-07-07 DIAGNOSIS — J45991 Cough variant asthma: Secondary | ICD-10-CM | POA: Diagnosis not present

## 2017-07-07 MED ORDER — MONTELUKAST SODIUM 10 MG PO TABS
10.0000 mg | ORAL_TABLET | Freq: Every day | ORAL | 11 refills | Status: DC
Start: 2017-07-07 — End: 2018-08-04

## 2017-07-07 MED ORDER — BUDESONIDE-FORMOTEROL FUMARATE 80-4.5 MCG/ACT IN AERO: INHALATION_SPRAY | RESPIRATORY_TRACT | Status: DC

## 2017-07-07 NOTE — Progress Notes (Signed)
Subjective:     Patient ID: Mariah Stevenson, female   DOB: 1945-08-09    MRN: [id number]     Brief patient profile:  72 yowf quit smoking in 1995 with a tendency then to recurrent  bronchitis which resolved completely and did fine until winter of 2016 and since then recurrent pattern of episodic cough esp  since winter 2017 but  even between bouts did ok until off maint rx until late spring/early summer 2017    then   needed maint for the first time = symbicort 160 2bid and referred to pulmonary clinic 10/05/2015 by Mariah Stevenson for f/u of abn CT chest from 07/25/15 with a nl baseline cxr at that point with evidence of BOOP on HRCT 10/09/15    History of Present Illness  10/05/2015 1st [facility] office visit/ Mariah Stevenson   Chief Complaint  Patient presents with  . Pulmonary Consult    Referred by Mariah Stevenson for eval of cough and pulmonary nodules. She c/o cough off and on since Jan 2017. Her cough has been better here recently, and is occ prod with clear sputum.    2 rounds of prednisone this year and did feel better and last used it mid May 2017  New doe = MMRC1 = can walk nl pace, flat grade, can't hurry or go uphills or steps s sob   Mucus clear/ cough since last round of prednisone but throat feels dry/ tight all the time but not while sleeping  rec Try symbicort 80 Take 2 puffs first thing in am and then another 2 puffs about 12 hours later.  Only use your albuterol as a rescue medication  Work on inhaler technique:   Change prilosec to Take 30-60 min before first meal of the day  GERD  Diet    10/24/2015  f/u ov/Mariah Stevenson re:  boop ? Cough variant asthma?  maint on symb 802bid but poor hfa Chief Complaint  Patient presents with  . Follow-up    Cough had got better, but cough returning this week,dry usually, clear occass.Denies sob or wheezing. Midchest tightness 1 episode.Only used Albuterol 1x in last week  was 100% improved then one week prior noted worse cough/tightness  with heat  humidity rec Plan A = Automatic = Symbicort 80 Take 2 puffs first thing in am and then another 2 puffs about 12 hours later.  Plan B = Backup Only use your albuterol (ventolin) as a rescue medication      01/16/2016  f/u ov/Mariah Stevenson re: cough variant asthma/ maint rx symb 80 2bid / no need for rescue/ no recent pred Chief Complaint  Patient presents with  . Follow-up    Had her PFT Pt. states breathing is doing good, Using her symbicort as normal, hasn't needed her albuterol   Not limited by breathing from desired activities   rec Pantoprazole (protonix) 40 mg   Take  30-60 min before first meal of the day and when coughing add Pepcid (famotidine)  20 mg one @  bedtime until return to office - this is the best way to tell whether stomach acid is contributing to your problem.   Please remember to go to the lab  department downstairs for your tests - we will call you with the results when they are available. Ok to try off symbiocort to see what change if any you notice and if worse start back on it right away     07/15/2016  f/u ov/Mariah Stevenson re: ? boop/ ?  Cough variant asthma  Chief Complaint  Patient presents with  . Acute Visit    Was on Symbicort but stop taking it after a few months. Felt fine after the first few weeks, but the cough has returned. Was given a prednisone taper and it helped some but the cough is still here. Mainly a non-productive cough but when it is productive, clear phlegm.    at baseline off all inhalers x months on prilosec before bfast no pepcid  Cough started late dec 2017 / early jan 2018 and started symbicort 80 and maybe 25% better and pred helped briefly then relapsed while still taking symbicort 80  Cough worse p stirs but Does not awaken her prematurely. rec  Prilosec  40 mg   Take  30-60 min before first meal of the day and add back Pepcid (famotidine)  20 mg one @  bedtime until return to office - this is the best way to tell whether stomach acid is contributing  to your problem.  GERD Prednisone 10 mg take  4 each am x 2 days,   2 each am x 2 days,  1 each am x 2 days and stop      08/12/2016  f/u ov/Mariah Stevenson re: f/u boop/ cough variant asthma on symb 80 2bid/ max gerd rx  Chief Complaint  Patient presents with  . Follow-up    Breathing is doing well today. Her cough is unchanged. No new co's today.    cough is almost all daytime with sensation of globus assoc with nasal congestion  but no excess/ purulent sputum or mucus plugs  - not better p prednisone  Not limited by breathing from desired activities   rec Change symbicort to just taking the 2 pffs in am and gargle with baking soda toothpaste If start worse cough sleeping then restart the symbicort Please see patient coordinator before you leave today  to schedule sinus CT and if neg the next step for the throat is a medication called gabapentin in very low doses for irritable larynx syndrome      09/23/2016  f/u ov/Mariah Stevenson re: cough for a year, best rx is gabapentin but issue was with tolerance  Chief Complaint  Patient presents with  . Follow-up    Breathing is overall doing well. She states her cough seems worse since the last visit. She has not had to use her albuterol inhaler.   presently on symbicort 80 2 q am and never had noct cough or wheeze but started back on symb and stopped gabapentin and cough day > noct dry > wet since then  rec Change symbicort to just taking the 2 pffs in am and gargle with baking soda toothpaste If start worse cough sleeping then restart the symbicort in evening  Restart gabapentin 100  Mg each am am x one week then increase to am/evening  Dosing  Or take both at bedtime if can't tolerate it any other way      12/24/2016  f/u ov/Mariah Stevenson re: prob uacs with cough since winter 2016, best rx to date = gabapentin Chief Complaint  Patient presents with  . Follow-up    Cough is much improved, and is most of the time non prod. No new co's. She has not needed rescue  inhaler.   taking gabapentin 100 x 2 each am (instructions were to take 2 hs) and helps cough but feeling fuzzy on higher doses No noct cough on just symb 80 each am and Not limited  by breathing from desired activities   Now on fosfamax s flare on gerd rx also  rec Change gabapentin to one in am and one at bedtime to see what difference if any this makes in your cough or fuzziness  Try off symbicort but restart if cough flares  Please schedule a follow up visit in 3 months but call sooner if needed     01/13/2017 acute ov/Donell Tomkins re: recurrent cough  Chief Complaint  Patient presents with  . Acute Visit    Started coughing 01/02/17 so she started back on Symbicort and this has helped some. She is coughing up some minimal grey sputum.  She has not had to use her rescue inhaler.    On gabapentin as instructed and cough started to get worse about a week p  Stopped symb then restarted it  And some better  Takes the fosfamax typically of fridays but overall thinks cough may be worse vs before started despite ppi qam /h2hs Cough is sporadic/ does wake her up most nocts but not typically in am  rec Continue prilosec 40 mg  Take 30-60 min before first meal of the day and pepcid 20 mg at bedtime  Hold fosfamax and consider RECLAST once yearly instead For drainage / throat tickle try take CHLORPHENIRAMINE  4 mg - take one every 4 hours as needed - available over the counter- may cause drowsiness so start with just a bedtime dose(one hour before bed)  or two and see how you tolerate it before trying in daytime        02/24/2017  f/u ov/Dorota Heinrichs re:  Cough which she how reports has not 100% resolved since  2013 /  off fosfamax since last ov Chief Complaint  Patient presents with  . Follow-up    Increased cough for the past 3 wks- feels like she is having some PND.   taking h1 and h2  one of each bedtime and sleeping fine and does not wake her up  Sporadic cough only in am  p stirring but waits one hour  before taking any of her meds and only using  gabapentin 100 bid with   daytime sense of pnds esp p supper  but no excess mucus produced when she does. No candy on hand rec Change the am symbicort and prilosec and gabapentin first thing in am and eat within an hour Change the chlorpheniramine 4mg  to take it with supper and go ahead the bedtime dose as well  If not better after a week then resume the symbicort 2 pffs in pm also  - add consider methacholine challenge next ov if cough still present p increases the symb 80 to max dosing    04/07/2017  f/u ov/Nikyla Navedo re:  Cough x at least 2013  Chief Complaint  Patient presents with  . Follow-up    dry cough on and off,sinus headaches  HA with stress, does not wake up with them but notes them over R temple  Not using 1st gen at supper as rec - says hard to remember to take because cough is sporadic (note last ov said each pm p supper)  Taking gabapentin hs and sleeps fine but cough tends to be worse after supper Not limited by breathing from desired activities  But not aerobically active  rec Add gabapentin 100 mg and chlorpheniramine 4 mg at bedtime Please schedule a follow up visit in 3 months but call sooner if needed  Late add: correction:  The gabapentin  and chlorpheniramine were supposed to be supper time daily (she already takes hs doses)     07/07/2017  f/u ov/Herold Salguero re:  Cough x at least 2013  Chief Complaint  Patient presents with  . Follow-up    states she does not see a significant change with the gabapentin  Dyspnea:  Not limited by breathing from desired activities   Cough: more x one month but min prod mucoid, esp worse first in am but does  not wake her / concerned about variable hoarsensess since started symbicort  Sleep: ok SABA use: never  Not using supper time dosing of meds as instructed but now denies p supper cough as a problem vs prior ov   No obvious other patterns in day to day or daytime variability or assoc excess/  purulent sputum or mucus plugs or hemoptysis or cp or chest tightness, subjective wheeze or overt sinus or hb symptoms. No unusual exposure hx or h/o childhood pna/ asthma or knowledge of premature birth.  Sleeping ok flat without nocturnal  or early am exacerbation  of respiratory  c/o's or need for noct saba. Also denies any obvious fluctuation of symptoms with weather or environmental changes or other aggravating or alleviating factors except as outlined above   Current Allergies, Complete Past Medical History, Past Surgical History, Family History, and Social History were reviewed in [facility] electronic medical record.  ROS  The following are not active complaints unless bolded Hoarseness, sore throat, dysphagia, dental problems, itching, sneezing,  nasal congestion or discharge of excess mucus or purulent secretions, ear ache,   fever, chills, sweats, unintended wt loss or wt gain, classically pleuritic or exertional cp,  orthopnea pnd or leg swelling, presyncope, palpitations, abdominal pain, anorexia, nausea, vomiting, diarrhea  or change in bowel habits or change in bladder habits, change in stools or change in urine, dysuria, hematuria,  rash, arthralgias, visual complaints, headache, numbness, weakness or ataxia or problems with walking or coordination,  change in mood/affect or memory.        Current Meds  Medication Sig  . buPROPion (WELLBUTRIN XL) 150 MG 24 hr tablet Take 150 mg by mouth daily. Take 2 tablets po daily                              Objective:   Physical Exam      amb wf/ mildly hoarse    07/07/2017          195  04/07/2017          204  02/24/2017      201  01/13/2017      203  12/24/2016        203  09/23/2016        198  08/12/2016        198  07/15/2016       199  01/16/2016     195  12/05/2015          195   10/24/2015       193   10/05/15 190 lb 9.6 oz (86.456 kg)  12/06/14 202 lb (91.627 kg)  06/21/13 202 lb (91.627 kg)      Vital  signs reviewed - Note on arrival 02 sats  95% on RA       HEENT: nl dentition, , and oropharynx. Nl external ear canals without cough reflex - mild  bilateral non-specific turbinate edema L >R  NECK :  without JVD/Nodes/TM/ nl carotid upstrokes bilaterally   LUNGS: no acc muscle use,  Nl contour chest which is clear to A and P bilaterally without cough on insp or exp maneuvers   CV:  RRR  no s3 or murmur or increase in P2, and no edema   ABD:  soft and nontender with nl inspiratory excursion in the supine position. No bruits or organomegaly appreciated, bowel sounds nl  MS:  Nl gait/ ext warm without deformities, calf tenderness, cyanosis or clubbing No obvious joint restrictions   SKIN: warm and dry without lesions    NEURO:  alert, approp, nl sensorium with  no motor or cerebellar deficits apparent.                 Assessment:

## 2017-07-07 NOTE — Patient Instructions (Addendum)
Singulair 10 mg one each pm and try off symbicort if you can - if not resume the am dose and add pm dose if still not better   Please schedule a follow up visit in 3 months but call sooner if needed  - add next step MCT then [facility]

## 2017-07-08 ENCOUNTER — Encounter: Payer: Self-pay | Admitting: Internal Medicine

## 2017-07-08 NOTE — Assessment & Plan Note (Signed)
-   gabapentin 100 tid 08/19/2016 >>> improved but  Could not tol 09/23/2016 so rec bid or 2 hs dosing - added fosfamax late spring / early summer 2018  - 12/24/2016 trial off symbicort and continue gabapentin 100 bid (instead of 2 q am) > worse w/in  One week - 01/13/2017 added h1 and d/c'ed  fosfamax - 01/13/2017  After extensive coaching HFA effectiveness =    90%  - 04/07/2017 rec rechallenge with gabapentin 100 tid and extra chlorpheniramine at supper> did neither of these recs  - 07/07/2017 trial of singulair since hoarse on symb 80 and wean off symb if tol  - consider MCT next if dx not clear  Chronic cough x 2013 overall some better probably more due to gabapentin than allergy/asthma rx to date and now hoarse on symb so reasonable to try singulair and off all inhalers if tol  I had an extended discussion with the patient reviewing all relevant studies completed to date and  lasting 15 to 20 minutes of a 25 minute visit    Each maintenance medication was reviewed in detail including most importantly the difference between maintenance and prns and under what circumstances the prns are to be triggered using an action plan format that is not reflected in the computer generated alphabetically organized AVS.    Please see AVS for specific instructions unique to this visit that I personally wrote and verbalized to the the pt in detail and then reviewed with pt  by my nurse highlighting any  changes in therapy recommended at today's visit to their plan of care.

## 2017-07-14 DIAGNOSIS — I6782 Cerebral ischemia: Secondary | ICD-10-CM | POA: Diagnosis not present

## 2017-07-14 DIAGNOSIS — G250 Essential tremor: Secondary | ICD-10-CM | POA: Diagnosis not present

## 2017-07-14 DIAGNOSIS — R4182 Altered mental status, unspecified: Secondary | ICD-10-CM | POA: Diagnosis not present

## 2017-07-24 DIAGNOSIS — E7800 Pure hypercholesterolemia, unspecified: Secondary | ICD-10-CM | POA: Diagnosis not present

## 2017-07-24 DIAGNOSIS — F322 Major depressive disorder, single episode, severe without psychotic features: Secondary | ICD-10-CM | POA: Diagnosis not present

## 2017-07-24 DIAGNOSIS — Z79899 Other long term (current) drug therapy: Secondary | ICD-10-CM | POA: Diagnosis not present

## 2017-07-24 DIAGNOSIS — Z0000 Encounter for general adult medical examination without abnormal findings: Secondary | ICD-10-CM | POA: Diagnosis not present

## 2017-08-11 DIAGNOSIS — G250 Essential tremor: Secondary | ICD-10-CM | POA: Diagnosis not present

## 2017-08-11 DIAGNOSIS — I6782 Cerebral ischemia: Secondary | ICD-10-CM | POA: Diagnosis not present

## 2017-08-11 DIAGNOSIS — R441 Visual hallucinations: Secondary | ICD-10-CM | POA: Diagnosis not present

## 2017-08-11 DIAGNOSIS — R4182 Altered mental status, unspecified: Secondary | ICD-10-CM | POA: Diagnosis not present

## 2017-08-18 DIAGNOSIS — Z1231 Encounter for screening mammogram for malignant neoplasm of breast: Secondary | ICD-10-CM | POA: Diagnosis not present

## 2017-09-01 DIAGNOSIS — R5383 Other fatigue: Secondary | ICD-10-CM | POA: Diagnosis not present

## 2017-09-01 DIAGNOSIS — K5900 Constipation, unspecified: Secondary | ICD-10-CM | POA: Diagnosis not present

## 2017-09-01 DIAGNOSIS — Z1211 Encounter for screening for malignant neoplasm of colon: Secondary | ICD-10-CM | POA: Diagnosis not present

## 2017-09-10 DIAGNOSIS — R441 Visual hallucinations: Secondary | ICD-10-CM | POA: Diagnosis not present

## 2017-09-10 DIAGNOSIS — G250 Essential tremor: Secondary | ICD-10-CM | POA: Diagnosis not present

## 2017-09-10 DIAGNOSIS — R4182 Altered mental status, unspecified: Secondary | ICD-10-CM | POA: Diagnosis not present

## 2017-09-10 DIAGNOSIS — I6782 Cerebral ischemia: Secondary | ICD-10-CM | POA: Diagnosis not present

## 2017-09-11 ENCOUNTER — Encounter: Payer: Self-pay | Admitting: Internal Medicine

## 2017-09-17 DIAGNOSIS — K219 Gastro-esophageal reflux disease without esophagitis: Secondary | ICD-10-CM | POA: Diagnosis not present

## 2017-09-17 DIAGNOSIS — R195 Other fecal abnormalities: Secondary | ICD-10-CM | POA: Diagnosis not present

## 2017-09-17 DIAGNOSIS — R194 Change in bowel habit: Secondary | ICD-10-CM | POA: Diagnosis not present

## 2017-09-23 DIAGNOSIS — Z1211 Encounter for screening for malignant neoplasm of colon: Secondary | ICD-10-CM | POA: Diagnosis not present

## 2017-09-23 DIAGNOSIS — R5383 Other fatigue: Secondary | ICD-10-CM | POA: Diagnosis not present

## 2017-09-23 DIAGNOSIS — E7800 Pure hypercholesterolemia, unspecified: Secondary | ICD-10-CM | POA: Diagnosis not present

## 2017-10-07 ENCOUNTER — Ambulatory Visit: Payer: Medicare Other | Admitting: Internal Medicine

## 2017-10-08 ENCOUNTER — Encounter: Payer: Self-pay | Admitting: Internal Medicine

## 2017-10-08 ENCOUNTER — Ambulatory Visit: Payer: Medicare Other | Admitting: Internal Medicine

## 2017-10-08 ENCOUNTER — Other Ambulatory Visit: Payer: Self-pay | Admitting: Internal Medicine

## 2017-10-08 VITALS — BP 122/80 | HR 76 | Ht 66.0 in | Wt 192.8 lb

## 2017-10-08 DIAGNOSIS — J45991 Cough variant asthma: Secondary | ICD-10-CM | POA: Diagnosis not present

## 2017-10-08 NOTE — Patient Instructions (Addendum)
Try off symbicort to see if you notice any difference and if worse then resume it.    After another few weeks, stop am gabapentin to see if mentally sharper without it   Please schedule a follow up visit in 3 months but call sooner if needed   .

## 2017-10-08 NOTE — Progress Notes (Signed)
Subjective:     Patient ID: Mariah Stevenson, female   DOB: 22-Nov-1945    MRN: [id number]     Brief patient profile:  72 yowf quit smoking in 1995 with a tendency then to recurrent  bronchitis which resolved completely and did fine until winter of 2016 and since then recurrent pattern of episodic cough esp  since winter 2017 but  even between bouts did ok until off maint rx until late spring/early summer 2017    then   needed maint for the first time = symbicort 160 2bid and referred to pulmonary clinic 10/05/2015 by Dr Izola PriceMyers for f/u of abn CT chest from 07/25/15 with a nl baseline cxr at that point with evidence of BOOP on HRCT 10/09/15    History of Present Illness  10/05/2015 1st [facility] office visit/ Mariah Stevenson   Chief Complaint  Patient presents with  . Pulmonary Consult    Referred by Dr Carlyon ShadowSteven Stevenson for eval of cough and pulmonary nodules. She c/o cough off and on since Jan 2017. Her cough has been better here recently, and is occ prod with clear sputum.    2 rounds of prednisone this year and did feel better and last used it mid May 2017  New doe = MMRC1 = can walk nl pace, flat grade, can't hurry or go uphills or steps s sob   Mucus clear/ cough since last round of prednisone but throat feels dry/ tight all the time but not while sleeping  rec Try symbicort 80 Take 2 puffs first thing in am and then another 2 puffs about 12 hours later.  Only use your albuterol as a rescue medication  Work on inhaler technique:   Change prilosec to Take 30-60 min before first meal of the day  GERD  Diet     07/07/2017  f/u ov/Mariah Stevenson re:  Cough x at least 2013 - best rx is gabapentin  Chief Complaint  Patient presents with  . Follow-up    states she does not see a significant change with the gabapentin  Dyspnea:  Not limited by breathing from desired activities   Cough: more x one month but min prod mucoid, esp worse first in am but does  not wake her / concerned about variable hoarsensess  since started symbicort  Sleep: ok SABA use: never Not using supper time dosing of meds as instructed but now denies p supper cough as a problem vs prior ov rec Singulair 10 mg one each pm and try off symbicort if you can - if not resume the am dose and add pm dose if still not better Please schedule a follow up visit in 3 months but call sooner if needed  - add next step MCT then [facility]     10/08/2017  f/u ov/Mariah Stevenson re: cough since 2013/ best rx to date = gabapentin but poorly tol daytime dosing  Chief Complaint  Patient presents with  . Follow-up    Reports her cough has improved. Has intermitten hoarseness but much better.   Dyspnea:  Not limited by breathing from desired activities   Cough: better, notes sometimes in pm   SABA use: none 02: none     No obvious day to day or daytime variability or assoc excess/ purulent sputum or mucus plugs or hemoptysis or cp or chest tightness, subjective wheeze or overt sinus or hb symptoms.   Sleeping: no longer waking up with cough nor nocturnal  or early  am exacerbation  of respiratory  c/o's or need for noct saba. Also denies any obvious fluctuation of symptoms with weather or environmental changes or other aggravating or alleviating factors except as outlined above   No unusual exposure hx or h/o childhood pna/ asthma or knowledge of premature birth.  Current Allergies, Complete Past Medical History, Past Surgical History, Family History, and Social History were reviewed in [employer] [facility] record.  ROS  The following are not active complaints unless bolded Hoarseness, sore throat, dysphagia, dental problems, itching, sneezing,  nasal congestion or discharge of excess mucus or purulent secretions, ear ache,   fever, chills, sweats, unintended wt loss or wt gain, classically pleuritic or exertional cp,  orthopnea pnd or arm/hand swelling  or leg swelling, presyncope, palpitations, abdominal pain,  anorexia, nausea, vomiting, diarrhea  or change in bowel habits or change in bladder habits, change in stools or change in urine, dysuria, hematuria,  rash, arthralgias, visual complaints, headache, numbness, weakness or ataxia or problems with walking or coordination,  change in mood or  memory.        Current Meds  Medication Sig  . aspirin 81 MG tablet Take 81 mg by mouth daily.  Marland Kitchen atorvastatin (LIPITOR) 20 MG tablet Take 1 tablet by mouth daily.  Marland Kitchen BIOTIN PO Take 1 capsule by mouth daily.  . budesonide-formoterol (SYMBICORT) 80-4.5 MCG/ACT inhaler Take 2 puffs first thing in am and then another 2 puffs about 12 hours later.  Marland Kitchen buPROPion (WELLBUTRIN XL) 150 MG 24 hr tablet Take 150 mg by mouth daily. Take 2 tablets po daily  . clonazePAM (KLONOPIN) 2 MG tablet Take 5 mg by mouth 3 (three) times daily as needed for anxiety.  . clopidogrel (PLAVIX) 75 MG tablet Take 75 mg by mouth daily.  . famotidine (PEPCID) 20 MG tablet One at bedtime  . FLUoxetine (PROZAC) 20 MG capsule Take 60 mg by mouth daily.   . fluticasone (FLONASE) 50 MCG/ACT nasal spray Place 2 sprays into both nostrils daily.  Marland Kitchen gabapentin (NEURONTIN) 100 MG capsule Take 200 mg by mouth daily.  . montelukast (SINGULAIR) 10 MG tablet Take 1 tablet (10 mg total) by mouth at bedtime.  . Multiple Vitamin (MULTIVITAMIN) capsule Take 1 capsule by mouth daily.  Marland Kitchen omeprazole (PRILOSEC) 40 MG capsule Take 40 mg by mouth daily.                              Objective:   Physical Exam     amb wf minimally hoarse   10/08/2017        192  07/07/2017          195  04/07/2017          204  02/24/2017      201  01/13/2017      203  12/24/2016        203  09/23/2016        198  08/12/2016        198  07/15/2016       199  01/16/2016     195  12/05/2015          195   10/24/2015       193   10/05/15 190 lb 9.6 oz (86.456 kg)  12/06/14 202 lb (91.627 kg)  06/21/13 202 lb (91.627 kg)      Vital signs reviewed - Note on  arrival 02 sats  97%  on RA         HEENT: nl dentition, turbinates bilaterally, and oropharynx. Nl external ear canals without cough reflex   NECK :  without JVD/Nodes/TM/ nl carotid upstrokes bilaterally   LUNGS: no acc muscle use,  Nl contour chest which is clear to A and P bilaterally without cough on insp or exp maneuvers   CV:  RRR  no s3 or murmur or increase in P2, and no edema   ABD:  soft and nontender with nl inspiratory excursion in the supine position. No bruits or organomegaly appreciated, bowel sounds nl  MS:  Nl gait/ ext warm without deformities, calf tenderness, cyanosis or clubbing No obvious joint restrictions   SKIN: warm and dry without lesions    NEURO:  alert, approp, nl sensorium with  no motor or cerebellar deficits apparent.             Assessment:

## 2017-10-09 DIAGNOSIS — I6782 Cerebral ischemia: Secondary | ICD-10-CM | POA: Diagnosis not present

## 2017-10-09 DIAGNOSIS — G250 Essential tremor: Secondary | ICD-10-CM | POA: Diagnosis not present

## 2017-10-09 DIAGNOSIS — R4182 Altered mental status, unspecified: Secondary | ICD-10-CM | POA: Diagnosis not present

## 2017-10-09 DIAGNOSIS — R441 Visual hallucinations: Secondary | ICD-10-CM | POA: Diagnosis not present

## 2017-10-12 ENCOUNTER — Encounter: Payer: Self-pay | Admitting: Internal Medicine

## 2017-10-12 NOTE — Assessment & Plan Note (Signed)
-   Spirometry 10/05/2015  FEV1 2.00 (81%)  Ratio 88 p am symb 160 > change to 80 2bid  - Allergy profile 01/16/16 >  Eos 0.0/  IgE  67 neg RAST  - PFT's 01/16/2016 no airflow obstruction  - Sinus CT 08/19/2016 >>> Unchanged small left sphenoid sinus mucous retention cyst, otherwise clear sinuses. - gabapentin 100 tid 08/19/2016 >>> improved but  Could not tol 09/23/2016 so rec bid or 2 hs dosing - added fosfamax late spring / early summer 2018  - 12/24/2016 trial off symbicort and continue gabapentin 100 bid (instead of 2 q am) > worse w/in  One week - 01/13/2017 added h1 and d/c'ed  fosfamax - 01/13/2017  After extensive coaching HFA effectiveness =    90%  - 04/07/2017 rec rechallenge with gabapentin 100 tid and extra chlorpheniramine at supper> did neither of these recs  - 07/07/2017 trial of singulair since hoarse on symb 80 > improved 10/08/2017 so rec wean off symb then try off am dose of gabapentin if tol    Explained any step down rx should be separated from other changes by at least a week for the impact to be appreciated .   Each maintenance medication was reviewed in detail including most importantly the difference between maintenance and as needed and under what circumstances the prns are to be used.  Please see AVS for specific  Instructions which are unique to this visit and I personally typed out  which were reviewed in detail in writing with the patient and a copy provided.

## 2017-10-23 ENCOUNTER — Encounter: Payer: Self-pay | Admitting: Neurology

## 2017-10-23 ENCOUNTER — Other Ambulatory Visit: Payer: Self-pay

## 2017-10-23 ENCOUNTER — Ambulatory Visit: Payer: Medicare Other | Admitting: Neurology

## 2017-10-23 ENCOUNTER — Encounter: Payer: Self-pay | Admitting: Psychology

## 2017-10-23 ENCOUNTER — Telehealth: Payer: Self-pay | Admitting: Neurology

## 2017-10-23 VITALS — BP 115/77 | HR 80 | Ht 66.0 in | Wt 192.0 lb

## 2017-10-23 DIAGNOSIS — R5382 Chronic fatigue, unspecified: Secondary | ICD-10-CM | POA: Diagnosis not present

## 2017-10-23 DIAGNOSIS — R413 Other amnesia: Secondary | ICD-10-CM

## 2017-10-23 DIAGNOSIS — E538 Deficiency of other specified B group vitamins: Secondary | ICD-10-CM

## 2017-10-23 NOTE — Progress Notes (Signed)
Reason for visit: Memory disturbance, confusion  Referring physician: Dr. Magdalen Spatz is a 72 y.o. female  History of present illness:  Ms. Reggio is a 72 year old right-handed white female who was previously evaluated through this office in 2014.  At that time, the patient had episodes of confusion that she claims would last about 5 minutes, the patient would suddenly feel disoriented and not recognize her surroundings.  The patient also complained of intermittent headache issues.  At that time she also reported mild generalized memory problems, a work-up included MRI of the brain that showed mild small vessel ischemic changes and blood work was done that was unremarkable.  The patient does have an underlying psychiatric history with difficulty with depression, this has been an ongoing problem for her.  In childhood, she had several events that were felt to be seizures, she was treated with phenobarbital for 1 year but she stopped the medication without having any recurring events.  It appears that since she was seen here in 2014, the patient was also evaluated for memory problems in 2016, a repeat MRI of the brain was done at that time.  The patient seemed to improve from this but then has worsened since March 2019.  Once again, she feels that she has a generalized memory problem, she is not focusing well, she has word finding problems, she has difficulty remembering names for people and things and has general short-term memory problems.  She will go into a room and not remember why she went in there.  She does not feel safe driving and has refrained from doing this.  She is able to keep up with medications and appointments fairly well.  She indicates that her mother had some mild memory problems prior to her death.  She claims that she is sleeping well but she does feel fatigued and drowsy during the day, her daughter has told her that she does snore at night.  The patient has  chronic tremors following the use of lithium many years ago.  The patient has had worsening depression over the last several months, this seems to correlate with the onset of the memory issues.  The patient was placed on Wellbutrin in addition to her Prozac.  The patient has been on Prozac for many years.  The patient claims that headaches at this time are not a significant issue for her.  She does have intermittent numbness in the hands at times, this was an issue in 2014.  The patient does report some constipation problems, recently she is being evaluated for blood in the stool.  She does report some difficulty with balance, she has not had any falls but she does tend to stagger to the left at times.  The patient has no visual complaints or difficulty with choking with swallowing.  She is sent to this office for an evaluation.  She has not had any blackouts or sudden events of confusion.  Past Medical History:  Diagnosis Date  . Altered awareness, transient   . Anxiety   . Depression   . Headache(784.0)   . Memory deficits 12/10/2012  . Seasonal allergies   . Sinus complaint     Past Surgical History:  Procedure Laterality Date  . TUBAL LIGATION      Family History  Problem Relation Age of Onset  . Stroke Father   . Emphysema Mother        smoked  . Asthma Grandchild   . Rectal  cancer Son   . Liver cancer Son     Social history:  reports that she quit smoking about 24 years ago. Her smoking use included cigarettes. She has a 30.00 pack-year smoking history. She has never used smokeless tobacco. She reports that she drinks alcohol. She reports that she does not use drugs.  Medications:  Prior to Admission medications   Medication Sig Start Date End Date Taking? Authorizing Provider  aspirin 81 MG tablet Take 81 mg by mouth daily.   Yes [provider]  atorvastatin (LIPITOR) 20 MG tablet Take 1 tablet by mouth daily. 07/18/16  Yes [provider]  BIOTIN PO Take 1  capsule by mouth daily.   Yes [provider]  buPROPion (WELLBUTRIN XL) 150 MG 24 hr tablet Take 150 mg by mouth daily. Take 2 tablets po daily   Yes [provider]  Cholecalciferol (VITAMIN D3 PO) Take 2,000 Units by mouth daily.   Yes [provider]  clonazePAM (KLONOPIN) 2 MG tablet Take 2 mg by mouth 3 (three) times daily as needed for anxiety.    Yes [provider]  clopidogrel (PLAVIX) 75 MG tablet Take 75 mg by mouth daily.   Yes [provider]  famotidine (PEPCID) 20 MG tablet One at bedtime 01/13/17  Yes Nyoka CowdenWert, Michael B, MD  FLUoxetine (PROZAC) 20 MG capsule Take 60 mg by mouth daily.  07/30/16  Yes [provider]  fluticasone (FLONASE) 50 MCG/ACT nasal spray Place 2 sprays into both nostrils daily. 12/12/16  Yes [provider]  gabapentin (NEURONTIN) 100 MG capsule TAKE 1 CAPSULE BY MOUTH 3 TIMES DAILY Patient taking differently: TAKE 1 CAPSULE BY MOUTH 2 TIMES DAILY 10/09/17  Yes Nyoka CowdenWert, Michael B, MD  montelukast (SINGULAIR) 10 MG tablet Take 1 tablet (10 mg total) by mouth at bedtime. 07/07/17  Yes Nyoka CowdenWert, Michael B, MD  Multiple Vitamin (MULTIVITAMIN) capsule Take 1 capsule by mouth daily.   Yes [provider]  omeprazole (PRILOSEC) 40 MG capsule Take 40 mg by mouth daily.   Yes [provider]      Allergies  Allergen Reactions  . Sulfur Other (See Comments)    seizures  . Penicillins Rash    thrush in throat  . Lithium     tremors  . Prednisone     psychosis  . Primidone     Hallucinations    ROS:  Out of a complete 14 system review of symptoms, the patient complains only of the following symptoms, and all other reviewed systems are negative.  Snoring Blood in the stool Easy bruising, easy bleeding Aching muscles Memory loss, confusion, numbness, weakness, slurred speech, tremor Depression, anxiety, too much sleep, decreased energy, change in appetite, disinterest in  activities  Blood pressure 115/77, pulse 80, height 5\' 6"  (1.676 m), weight 192 lb (87.1 kg).  Physical Exam  General: The patient is alert and cooperative at the time of the examination.  The patient appears to have a slightly flat affect.  Eyes: Pupils are equal, round, and reactive to light. Discs are flat bilaterally.  Neck: The neck is supple, no carotid bruits are noted.  Respiratory: The respiratory examination is clear.  Cardiovascular: The cardiovascular examination reveals a regular rate and rhythm, no obvious murmurs or rubs are noted.  Skin: Extremities are without significant edema.  Neurologic Exam  Mental status: The patient is alert and oriented x 3 at the time of the examination. The patient has apparent normal recent and  remote memory, with an apparently normal attention span and concentration ability.  Mini-Mental status examination done today shows a total score of 29/30.  Cranial nerves: Facial symmetry is present. There is good sensation of the face to pinprick and soft touch bilaterally. The strength of the facial muscles and the muscles to head turning and shoulder shrug are normal bilaterally. Speech is well enunciated, no aphasia or dysarthria is noted. Extraocular movements are full. Visual fields are full. The tongue is midline, and the patient has symmetric elevation of the soft palate. No obvious hearing deficits are noted.  Motor: The motor testing reveals 5 over 5 strength of all 4 extremities. Good symmetric motor tone is noted throughout.  Sensory: Sensory testing is intact to pinprick, soft touch, vibration sensation, and position sense on all 4 extremities. No evidence of extinction is noted.  Coordination: Cerebellar testing reveals good finger-nose-finger and heel-to-shin bilaterally.  A fine intention tremor seen with finger-nose-finger bilaterally.  Gait and station: Gait is normal. Tandem gait is slightly unsteady. Romberg is negative. No drift  is seen.  Reflexes: Deep tendon reflexes are symmetric and normal bilaterally. Toes are downgoing bilaterally.   MRI brain 12/06/14:  IMPRESSION: 1.  No acute intracranial abnormality. 2. Progressed nonspecific white matter and pontine signal changes since 2014, most commonly due to chronic small vessel disease.  * MRI scan images were reviewed online. I agree with the written report.    Assessment/Plan:  1.  Reported memory disturbance  2.  History of depression  The patient has a prior history of undulating mental status problems, she reported similar issues in 2014, 2016, and at the present date.  The most recent event has been associated with increased problems with depression.  The patient may be suffering from a pseudodementia but an organic memory disorder does need to be excluded.  The patient does have small vessel disease by MRI.  A repeat MRI of the brain will be done.  She will have blood work done today.  She will be sent for neuropsychological evaluation.  She will follow-up in 5 months.  If the neuropsychological evaluation suggests an organic dementia, medication will be added for memory.  The patient does report snoring problems and daytime drowsiness, in the future a sleep study may be considered.  Marlan Palau MD 10/23/2017 7:55 AM  [facility] [address] [place], [place] [address]  Phone [phone] Fax [phone]

## 2017-10-23 NOTE — Patient Instructions (Signed)
We will get MRI of the brain and get neuropsychological testing done.

## 2017-10-23 NOTE — Telephone Encounter (Signed)
[employer] Berkley Harveyauth: [id number] (exp. 10/23/17 to 11/21/17) order sent to [facility]. They will reach out to the pt to schedule.

## 2017-10-24 LAB — VITAMIN B12: Vitamin B-12: >2000 pg/mL — ABNORMAL HIGH (ref 232–1245)

## 2017-10-24 LAB — AMMONIA: AMMONIA: 52 ug/dL (ref 19–87)

## 2017-10-24 LAB — SEDIMENTATION RATE: Sed Rate: 12 mm/hr (ref 0–40)

## 2017-10-24 LAB — TSH: TSH: 2.91 u[IU]/mL (ref 0.450–4.500)

## 2017-10-24 LAB — HIV ANTIBODY (ROUTINE TESTING W REFLEX): HIV Screen 4th Generation wRfx: NONREACTIVE

## 2017-10-27 ENCOUNTER — Telehealth: Payer: Self-pay | Admitting: *Deleted

## 2017-10-27 NOTE — Telephone Encounter (Signed)
-----   Message from York Spanielharles K Willis, MD sent at 10/24/2017  1:45 PM EDT -----  The blood work results are unremarkable. Please call the patient.  ----- Message ----- From: Nell RangeInterface, [facility] Lab Results In Sent: 10/24/2017   7:40 AM To: York Spanielharles K Willis, MD

## 2017-10-27 NOTE — Telephone Encounter (Signed)
Called and spoke with pt about unremarkable labs. She scheduled appt for neuropsych testing. She has not heard about scheduling MRI. I checked and order sent to [facility]. Advised she can call to schedule with them. She verbalized understanding.

## 2017-10-29 DIAGNOSIS — R195 Other fecal abnormalities: Secondary | ICD-10-CM | POA: Diagnosis not present

## 2017-10-29 DIAGNOSIS — D122 Benign neoplasm of ascending colon: Secondary | ICD-10-CM | POA: Diagnosis not present

## 2017-10-29 DIAGNOSIS — D120 Benign neoplasm of cecum: Secondary | ICD-10-CM | POA: Diagnosis not present

## 2017-10-29 DIAGNOSIS — D123 Benign neoplasm of transverse colon: Secondary | ICD-10-CM | POA: Diagnosis not present

## 2017-10-29 DIAGNOSIS — D124 Benign neoplasm of descending colon: Secondary | ICD-10-CM | POA: Diagnosis not present

## 2017-11-04 ENCOUNTER — Ambulatory Visit
Admission: RE | Admit: 2017-11-04 | Discharge: 2017-11-04 | Disposition: A | Discharge status: Home / Self Care | Payer: Medicare Other | Source: Ambulatory Visit | Attending: Neurology | Admitting: Neurology

## 2017-11-04 DIAGNOSIS — R413 Other amnesia: Secondary | ICD-10-CM

## 2017-11-05 ENCOUNTER — Telehealth: Payer: Self-pay | Admitting: Neurology

## 2017-11-05 DIAGNOSIS — R413 Other amnesia: Secondary | ICD-10-CM

## 2017-11-05 NOTE — Telephone Encounter (Signed)
  I called the patient.  MRI of the brain appears to be unchanged from 2016.  The patient is to get neuropsychological testing in the near future.  [facility], I will try to get something done through [facility].   MRI brain 11/05/17:  IMPRESSION:   MRI brain (without) demonstrating: - Stable, scattered periventricular and subcortical and pontine chronic small vessel ischemic disease. - No acute findings.

## 2017-11-10 ENCOUNTER — Other Ambulatory Visit: Payer: Self-pay | Admitting: Neurology

## 2017-11-10 DIAGNOSIS — R404 Transient alteration of awareness: Secondary | ICD-10-CM

## 2017-11-10 DIAGNOSIS — Z6830 Body mass index (BMI) 30.0-30.9, adult: Secondary | ICD-10-CM | POA: Diagnosis not present

## 2017-11-10 DIAGNOSIS — I6782 Cerebral ischemia: Secondary | ICD-10-CM | POA: Diagnosis not present

## 2017-11-10 DIAGNOSIS — R4182 Altered mental status, unspecified: Secondary | ICD-10-CM | POA: Diagnosis not present

## 2017-11-10 DIAGNOSIS — G250 Essential tremor: Secondary | ICD-10-CM | POA: Diagnosis not present

## 2017-11-17 ENCOUNTER — Telehealth: Payer: Self-pay | Admitting: Neurology

## 2017-11-17 ENCOUNTER — Ambulatory Visit: Payer: Medicare Other | Admitting: Neurology

## 2017-11-17 DIAGNOSIS — R404 Transient alteration of awareness: Secondary | ICD-10-CM | POA: Diagnosis not present

## 2017-11-17 NOTE — Telephone Encounter (Signed)
I called the patient.  The EEG study was normal. 

## 2017-11-17 NOTE — Procedures (Signed)
    History:  Mariah SalterDorothy Stevenson is a 72 year old patient who has experienced episodes of confusion that would last about 5 minutes.  The patient would feel disoriented and not recognize her surroundings, she also has complained of intermittent headaches.  She has underlying mild generalized memory problem as well.  She is being evaluated for the episodes of confusion.  This is a routine EEG.  No skull defects are noted.  Medications include aspirin, Lipitor, Wellbutrin, vitamin D, clonazepam, Plavix, Pepcid, Prozac, Flonase, gabapentin, Singulair, multivitamins, and Prilosec.  EEG classification: Normal awake and drowsy  Description of the recording: The background rhythms of this recording consists of a fairly well modulated medium amplitude alpha rhythm of 9 Hz that is reactive to eye opening and closure. As the record progresses, the patient appears to remain in the waking state throughout the recording. Photic stimulation was performed, resulting in a bilateral and symmetric photic driving response. Hyperventilation was also performed, resulting in a minimal buildup of the background rhythm activities without significant slowing seen. Toward the end of the recording, the patient enters the drowsy state with slight symmetric slowing seen. The patient never enters stage II sleep. At no time during the recording does there appear to be evidence of spike or spike wave discharges or evidence of focal slowing. EKG monitor shows no evidence of cardiac rhythm abnormalities with a heart rate of 66.  Impression: This is a normal EEG recording in the waking and drowsy state. No evidence of ictal or interictal discharges are seen.

## 2017-12-24 DIAGNOSIS — E7800 Pure hypercholesterolemia, unspecified: Secondary | ICD-10-CM | POA: Diagnosis not present

## 2017-12-24 DIAGNOSIS — F322 Major depressive disorder, single episode, severe without psychotic features: Secondary | ICD-10-CM | POA: Diagnosis not present

## 2017-12-24 DIAGNOSIS — M810 Age-related osteoporosis without current pathological fracture: Secondary | ICD-10-CM | POA: Diagnosis not present

## 2017-12-24 DIAGNOSIS — Z23 Encounter for immunization: Secondary | ICD-10-CM | POA: Diagnosis not present

## 2018-01-08 ENCOUNTER — Ambulatory Visit (INDEPENDENT_AMBULATORY_CARE_PROVIDER_SITE_OTHER): Payer: Medicare Other | Admitting: Internal Medicine

## 2018-01-08 ENCOUNTER — Encounter: Payer: Self-pay | Admitting: Internal Medicine

## 2018-01-08 VITALS — BP 124/80 | HR 74 | Ht 65.0 in | Wt 187.4 lb

## 2018-01-08 DIAGNOSIS — J45991 Cough variant asthma: Secondary | ICD-10-CM | POA: Diagnosis not present

## 2018-01-08 NOTE — Progress Notes (Signed)
Subjective:    Patient ID: Mariah Stevenson, female   DOB: [birth year]    MRN: [id number]     Brief patient profile:  72 yowf quit smoking in 1995 with a tendency then to recurrent  bronchitis which resolved completely and did fine until winter of 2016 and since then recurrent pattern of episodic cough esp  since winter 2017 but  even between bouts did ok until off maint rx until late spring/early summer 2017    then   needed maint for the first time = symbicort 160 2bid and referred to pulmonary clinic 10/05/2015 by Dr Izola Price for f/u of abn CT chest from 07/25/15 with a nl baseline cxr at that point with evidence of BOOP on HRCT 10/09/15    History of Present Illness  10/05/2015 1st [facility] office visit/ Pragya Lofaso   Chief Complaint  Patient presents with  . Pulmonary Consult    Referred by Dr Carlyon Shadow for eval of cough and pulmonary nodules. She c/o cough off and on since Jan 2017. Her cough has been better here recently, and is occ prod with clear sputum.    2 rounds of prednisone this year and did feel better and last used it mid May 2017  New doe = MMRC1 = can walk nl pace, flat grade, can't hurry or go uphills or steps s sob   Mucus clear/ cough since last round of prednisone but throat feels dry/ tight all the time but not while sleeping  rec Try symbicort 80 Take 2 puffs first thing in am and then another 2 puffs about 12 hours later.  Only use your albuterol as a rescue medication  Work on inhaler technique:   Change prilosec to Take 30-60 min before first meal of the day  GERD  Diet     07/07/2017  f/u ov/Aedyn Mckeon re:  Cough x at least 2013 - best rx is gabapentin  Chief Complaint  Patient presents with  . Follow-up    states she does not see a significant change with the gabapentin  Dyspnea:  Not limited by breathing from desired activities   Cough: more x one month but min prod mucoid, esp worse first in am but does  not wake her / concerned about variable hoarsensess since  started symbicort  Sleep: ok SABA use: never Not using supper time dosing of meds as instructed but now denies p supper cough as a problem vs prior ov rec Singulair 10 mg one each pm and try off symbicort if you can - if not resume the am dose and add pm dose if still not better Please schedule a follow up visit in 3 months but call sooner if needed  - add next step MCT then refer to [facility] / voice center     10/08/2017  f/u ov/Tyrelle Raczka re: cough since 2013/ best rx to date = gabapentin but poorly tol daytime dosing  Chief Complaint  Patient presents with  . Follow-up    Reports her cough has improved. Has intermitten hoarseness but much better.   Dyspnea:  Not limited by breathing from desired activities   Cough: better, notes sometimes in pm  SABA use: none 02: none   rec Try off symbicort to see if you notice any difference and if worse then resume it.  After another few weeks, stop am gabapentin to see if mentally sharper without it    01/08/2018  f/u ov/Basem Yannuzzi re:  Cough since 2013   / better than  it's ever been on singulair/ on gabapentin 100 bid / no inhalers at all Chief Complaint  Patient presents with  . Follow-up    Cough has improved some. No new co's.    Dyspnea:  Walking the neighborhood no problem Cough: no pattern / nasal drainage /congestion symptoms worse off lonase  Sleeping: flat / one pillow on side  SABA use: none , no inhalers  02: no 02    No obvious day to day or daytime variability or assoc excess/ purulent sputum or mucus plugs or hemoptysis or cp or chest tightness, subjective wheeze or overt sinus or hb symptoms.   Sleeping as above without nocturnal  or early am exacerbation  of respiratory  c/o's or need for noct saba. Also denies any obvious fluctuation of symptoms with weather or environmental changes or other aggravating or alleviating factors except as outlined above   No unusual exposure hx or h/o childhood pna/ asthma or knowledge of premature  birth.  Current Allergies, Complete Past Medical History, Past Surgical History, Family History, and Social History were reviewed in [employer] record.  ROS  The following are not active complaints unless bolded Hoarseness, sore throat, dysphagia, dental problems, itching, sneezing,  nasal congestion or discharge of excess mucus or purulent secretions, ear ache,   fever, chills, sweats, unintended wt loss or wt gain, classically pleuritic or exertional cp,  orthopnea pnd or arm/hand swelling  or leg swelling, presyncope, palpitations, abdominal pain, anorexia, nausea, vomiting, diarrhea  or change in bowel habits or change in bladder habits, change in stools or change in urine, dysuria, hematuria,  rash, arthralgias, visual complaints, headache, numbness, weakness or ataxia or problems with walking or coordination,  change in mood or  memory.        Current Meds  Medication Sig  . aspirin 81 MG tablet Take 81 mg by mouth daily.  Marland Kitchen atorvastatin (LIPITOR) 20 MG tablet Take 1 tablet by mouth daily.  Marland Kitchen BIOTIN PO Take 1 capsule by mouth daily.  Marland Kitchen buPROPion (WELLBUTRIN XL) 150 MG 24 hr tablet Take 3 tablets po daily  . Cholecalciferol (VITAMIN D3 PO) Take 2,000 Units by mouth daily.  . clonazePAM (KLONOPIN) 2 MG tablet Take 2 mg by mouth 3 (three) times daily as needed for anxiety.   . clopidogrel (PLAVIX) 75 MG tablet Take 75 mg by mouth daily.  . famotidine (PEPCID) 20 MG tablet One at bedtime  . FLUoxetine (PROZAC) 20 MG capsule Take 60 mg by mouth daily.   . fluticasone (FLONASE) 50 MCG/ACT nasal spray Place 2 sprays into both nostrils daily.  Marland Kitchen gabapentin (NEURONTIN) 100 MG capsule TAKE 1 CAPSULE BY MOUTH 3 TIMES DAILY (Patient taking differently: TAKE 1 CAPSULE BY MOUTH 2 TIMES DAILY)  . montelukast (SINGULAIR) 10 MG tablet Take 1 tablet (10 mg total) by mouth at bedtime.  . Multiple Vitamin (MULTIVITAMIN) capsule Take 1 capsule by mouth daily.  Marland Kitchen omeprazole (PRILOSEC)  40 MG capsule Take 40 mg by mouth daily.         Objective:   Physical Exam     amb wf / occ throat clearing    01/08/2018      187  10/08/2017        192  07/07/2017          195  04/07/2017          204  02/24/2017      201  01/13/2017      203  12/24/2016        203  09/23/2016        198  08/12/2016        198  07/15/2016       199  01/16/2016     195  12/05/2015          195   10/24/2015       193   10/05/15 190 lb 9.6 oz (86.456 kg)  12/06/14 202 lb (91.627 kg)  06/21/13 202 lb (91.627 kg)    Vital signs reviewed - Note on arrival 02 sats  97% on RA        HEENT: nl dentition and oropharynx which is pristine . Nl external ear canals without cough reflex - mild bilateral non-specific turbinate edema     NECK :  without JVD/Nodes/TM/ nl carotid upstrokes bilaterally   LUNGS: no acc muscle use,  Nl contour chest which is clear to A and P bilaterally without cough on insp or exp maneuvers   CV:  RRR  no s3 or murmur or increase in P2, and no edema   ABD:  soft and nontender with nl inspiratory excursion in the supine position. No bruits or organomegaly appreciated, bowel sounds nl  MS:  Nl gait/ ext warm without deformities, calf tenderness, cyanosis or clubbing No obvious joint restrictions   SKIN: warm and dry without lesions    NEURO:  alert, approp, nl sensorium with  no motor  deficits apparent./ mild resting tremor            Assessment:

## 2018-01-08 NOTE — Assessment & Plan Note (Signed)
-   Spirometry 10/05/2015  FEV1 2.00 (81%)  Ratio 88 p am symb 160 > change to 80 2bid  - Allergy profile 01/16/16 >  Eos 0.0/  IgE  67 neg RAST  - PFT's 01/16/2016 no airflow obstruction  - Sinus CT 08/19/2016 >>> Unchanged small left sphenoid sinus mucous retention cyst, otherwise clear sinuses. - gabapentin 100 tid 08/19/2016 >>> improved but  Could not tol 09/23/2016 so rec bid or 2 hs dosing - added fosfamax late spring / early summer 2018  - 12/24/2016 trial off symbicort and continue gabapentin 100 bid (instead of 2 q am) > worse w/in  One week - 01/13/2017 added h1 and d/c'ed  fosfamax - 01/13/2017  After extensive coaching HFA effectiveness =    90%  - 04/07/2017 rec rechallenge with gabapentin 100 tid and extra chlorpheniramine at supper> did neither of these recs - 07/07/2017 trial of singulair since hoarse on symb 80 > improved 10/08/2017 so rec wean off symb then try off am dose of gabapentin if tol  > improved further 01/08/2018 off all inhalers   Evidence here is very strong this is all uacs assoc with pnds which may respond to nasal steroids and if not ok to add astelin.  No problem with using beta blockers for tremor since convinced now there is no asthma    Each maintenance medication was reviewed in detail including most importantly the difference between maintenance and as needed and under what circumstances the prns are to be used.  Please see AVS for specific  Instructions which are unique to this visit and I personally typed out  which were reviewed in detail in writing with the patient and a copy provided.

## 2018-01-08 NOTE — Patient Instructions (Addendum)
Ok to try flonase up to 2 pffs every 12 hours to see if it helps/ itching/ sneezing    I emphasized that nasal steroids(like flonase)  have no immediate benefit in terms of improving symptoms.  To help them reached the target tissue, you should use Afrin one- two puffs every 12 hours applied one min before using the nasal steroids.  Afrin should be stopped after no more than 5 days.  If the symptoms worsen, Afrin can be restarted after 5 days off of therapy to prevent rebound congestion from overuse of Afrin.  I also emphasized that in no way are nasal steroids a concern in terms of "addiction".     Try taking one extra gabapentin during the day if needed for control of the throat clearing.    Propranolol (Inderol) is an option for tremor now that we're pretty sure asthma is not your problem - discuss this with Dr Anne Hahn at your next visit if you're not happy with clonazepam   Please schedule a follow up visit in 3 months but call sooner if needed

## 2018-01-09 IMAGING — DX DG CHEST 2V
2 series · 2 of 2 positions shown · non-contrast
Comparison: PA and lateral chest x-ray July 17, 2015 and chest
CT scan of July 25, 2015

CLINICAL DATA: Intermittent cough for 6 months, acute bronchitic
episodes 3 times this year, pulmonary nodules demonstrated on CT
scan in July 2015

EXAM:
CHEST  2 VIEW

[chest pa]
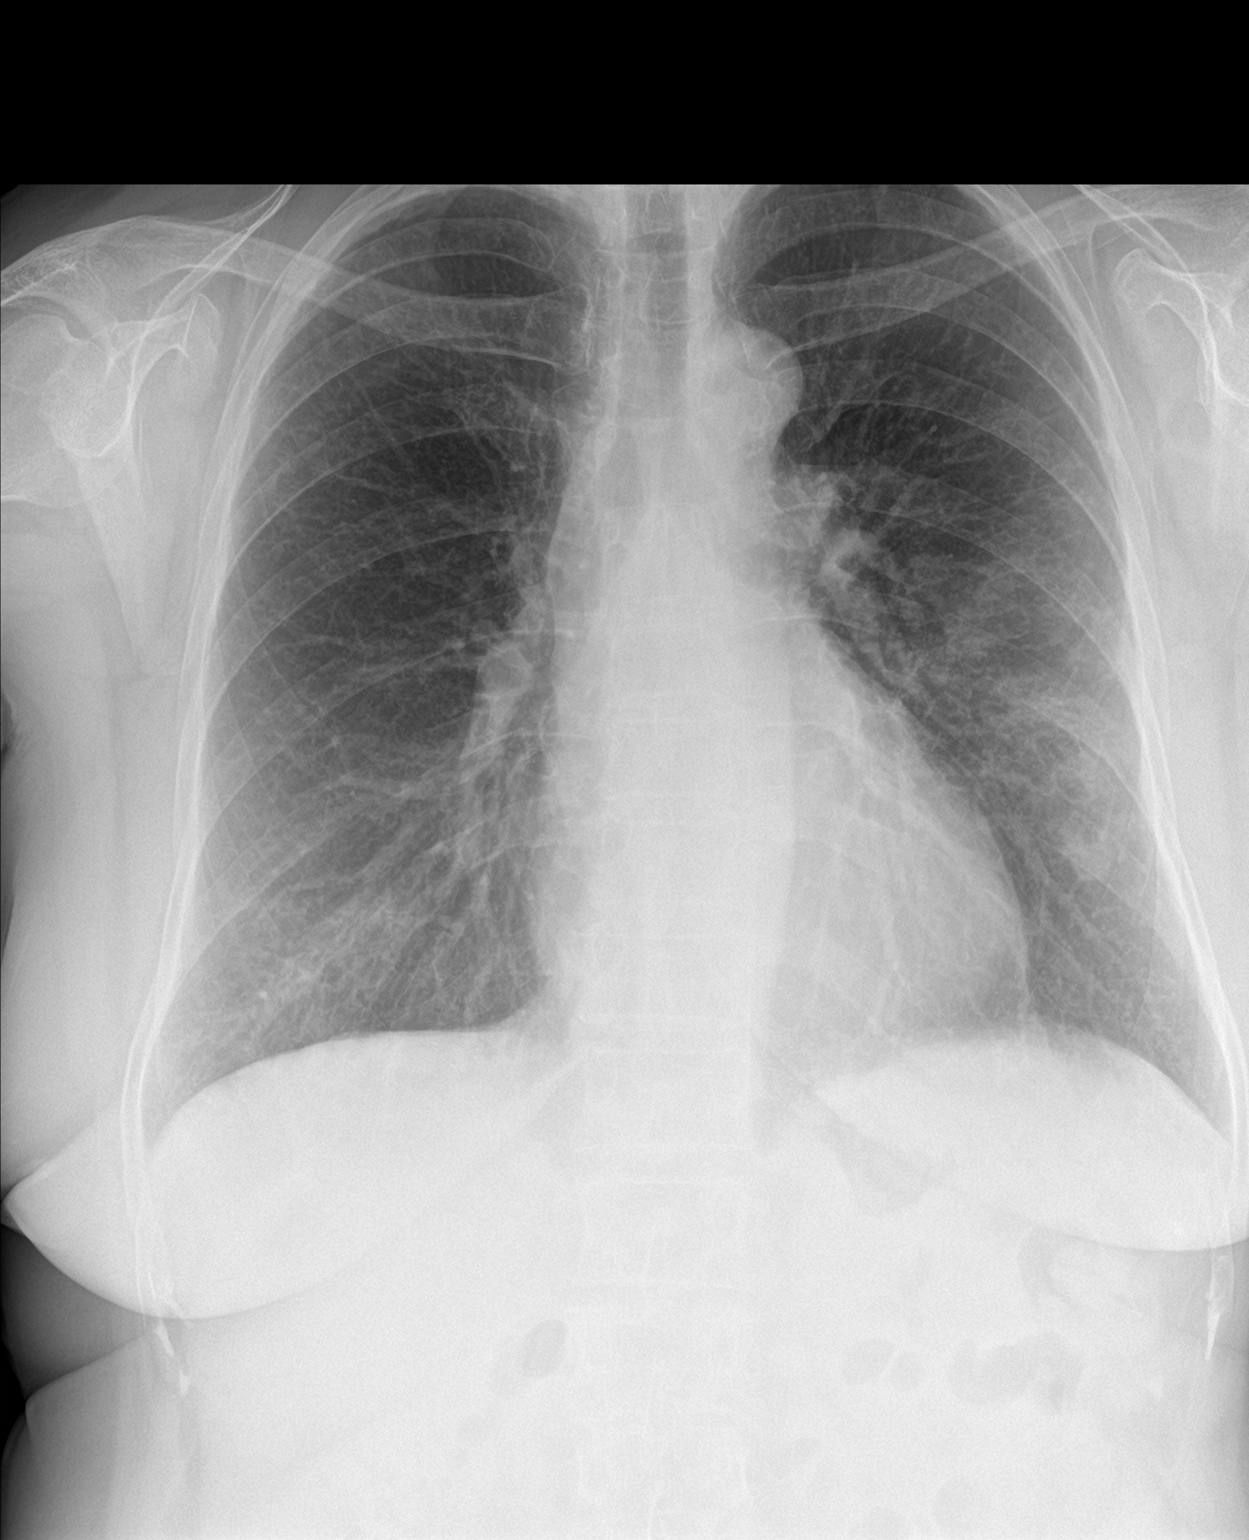

[chest lat]
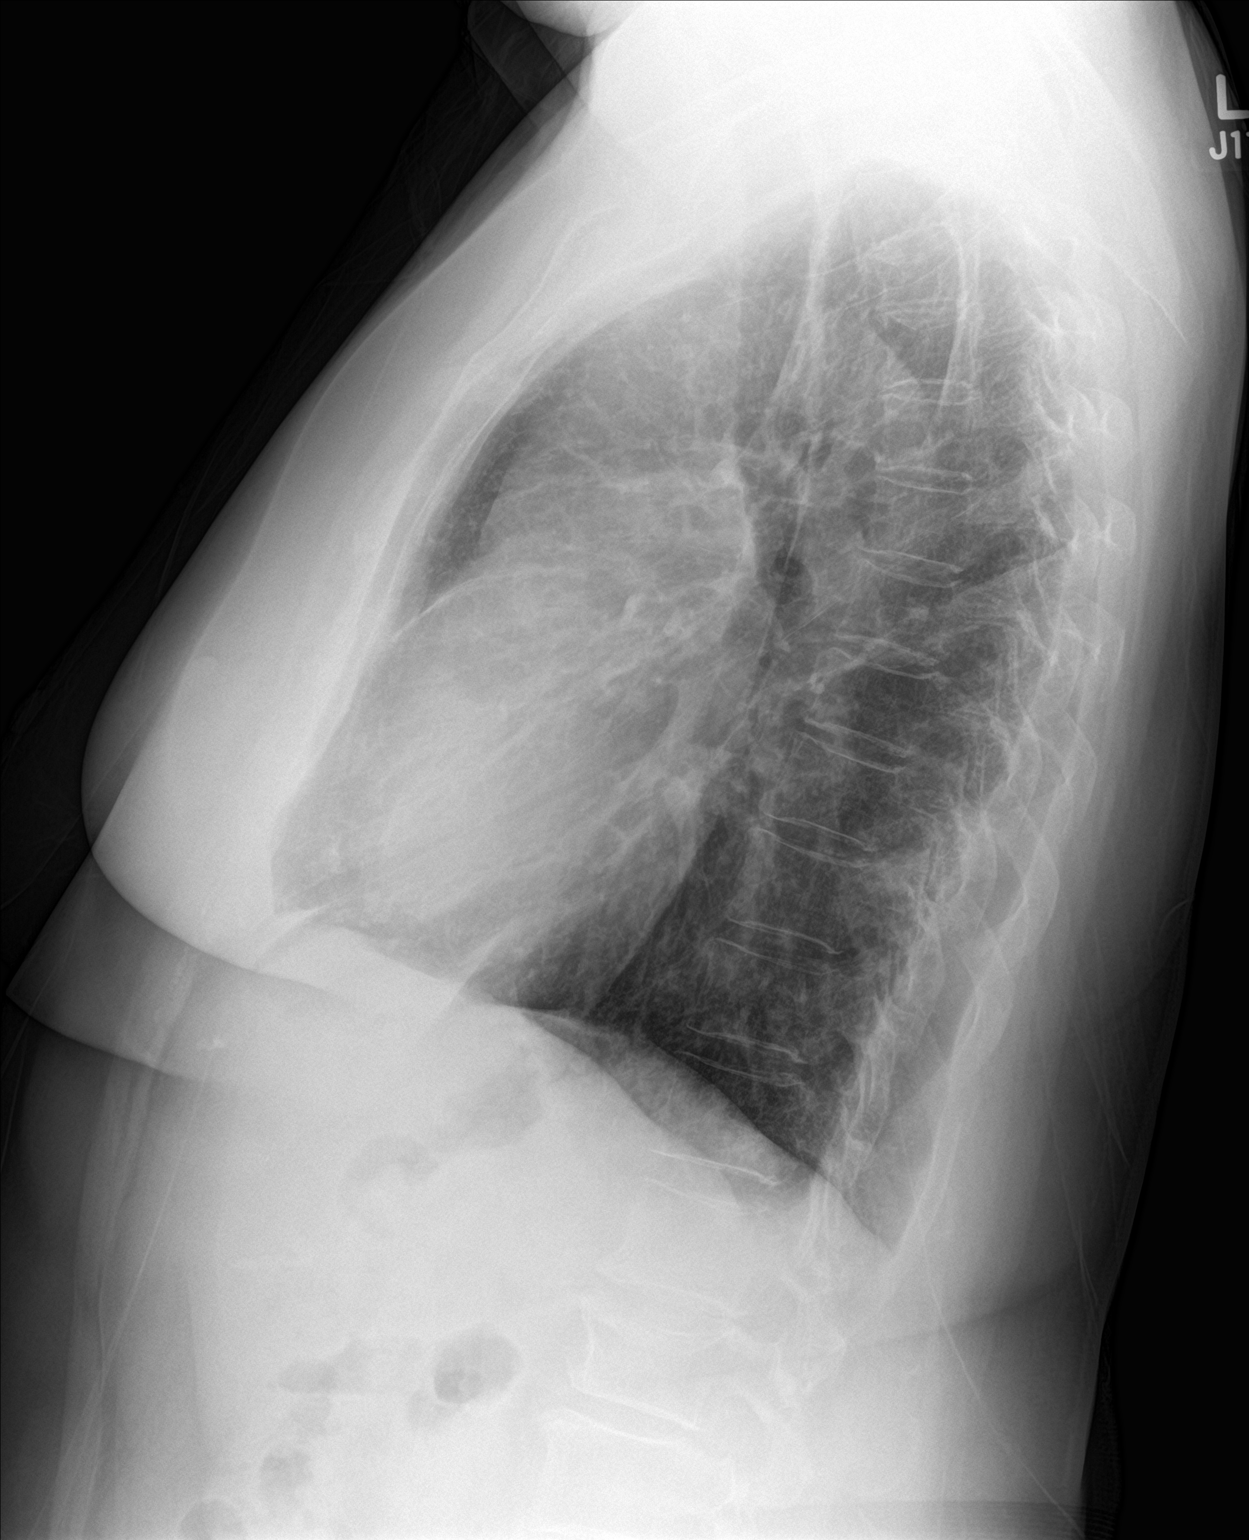

[2 of 2 positions shown; findings below may reference images not displayed]

FINDINGS: The lungs remain hyperinflated with hemidiaphragm flattening. There
is new subtle nodular density projecting lateral to the cardiac apex
which appears to lie in the anterior aspect of the left lower lobe
it measures in diameter. A tiny nodular density projecting over the
seventh thoracic vertebral body has increased in size and now
measures approximately 5 mm in diameter. There is no alveolar
infiltrate. The interstitial markings are coarse greatest on the
left. The heart and pulmonary vascularity are normal. The
mediastinum is normal in width. The trachea is midline. The bony
thorax exhibits no acute abnormality.
IMPRESSION: Nodules in the left lung which are new or more conspicuous than on
the previous study. Given the findings on the previous chest CT scan
and the fact that it has been 2 [DATE] months since the previous study,
a repeat noncontrast chest CT scan is recommended.

COPD.  No pneumonia nor CHF.

## 2018-01-13 IMAGING — CT CT CHEST HIGH RESOLUTION W/O CM
2 of 6 series · 14 of 36 positions shown, 17 images · non-contrast
Comparison: Chest CT 07/25/2015.

CLINICAL DATA: 70-year-old female with history of persistent cough
and slight shortness of breath after recurrent bronchitis in
March 2015 through July 2015. Evaluate for potential
interstitial lung disease.

EXAM:
CT CHEST WITHOUT CONTRAST
TECHNIQUE: Multidetector CT imaging of the chest was performed following the
standard protocol without intravenous contrast. High resolution
imaging of the lungs, as well as inspiratory and expiratory imaging,
was performed.

[Series 4: high resolution · axial · 0.66mm/px · z∈[-302,-34]mm · 11 of 150 slices shown, 14 images]
[im 8/150  mediastinal]
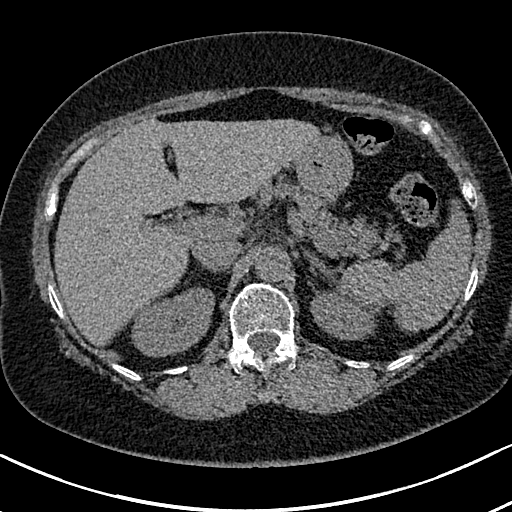
[im 8/150  lung]
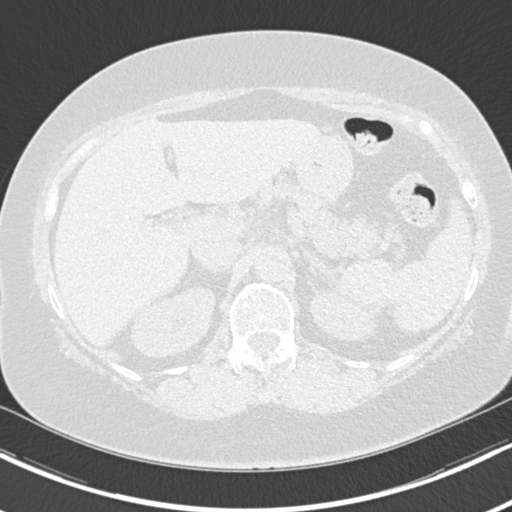
[im 24/150  lung]
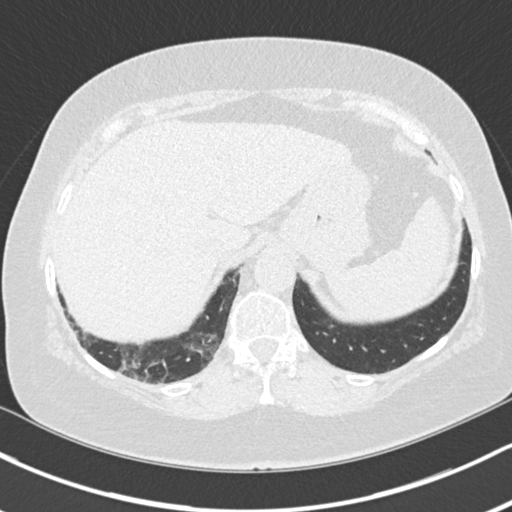
[im 40/150  lung]
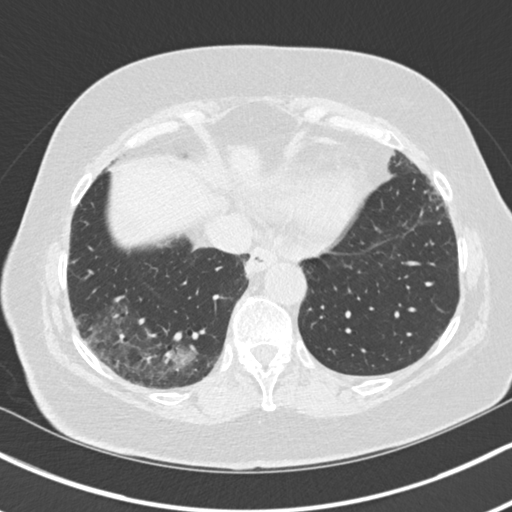
[im 48/150  lung]
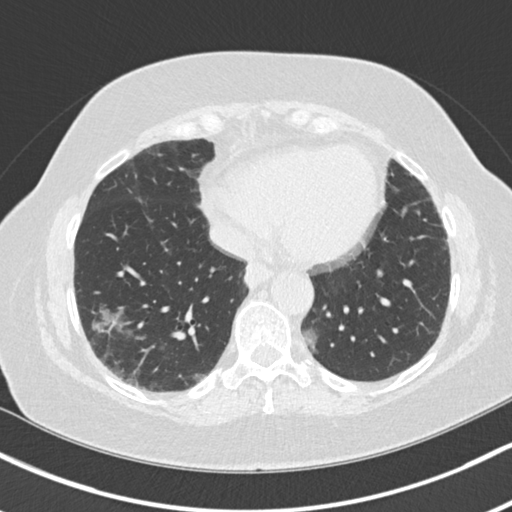
[im 63/150  mediastinal]
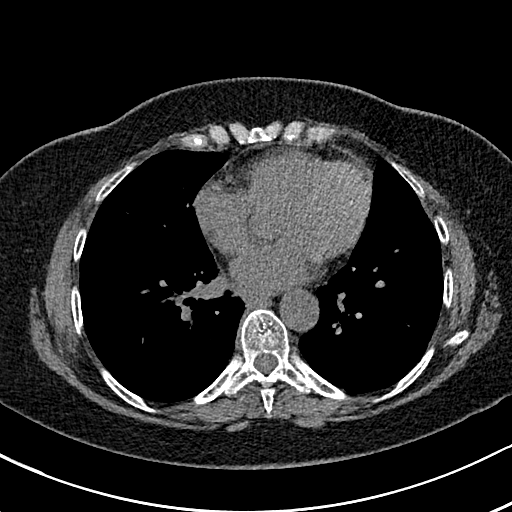
[im 63/150  lung]
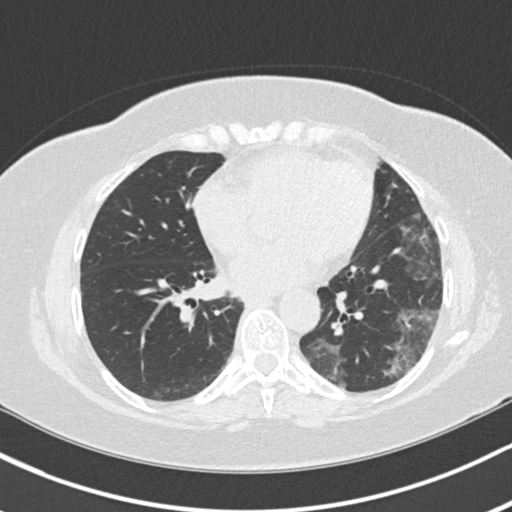
[im 79/150  lung]
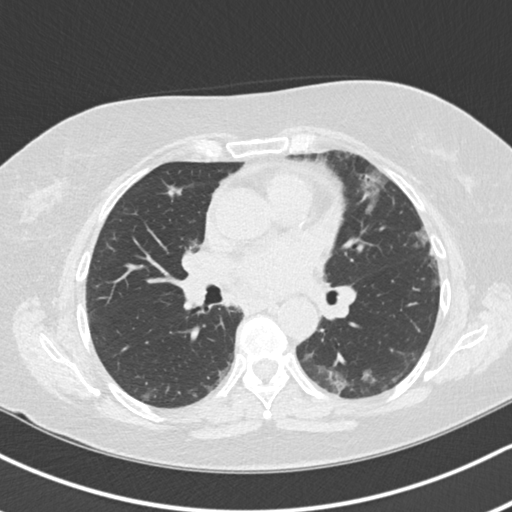
[im 87/150  lung]
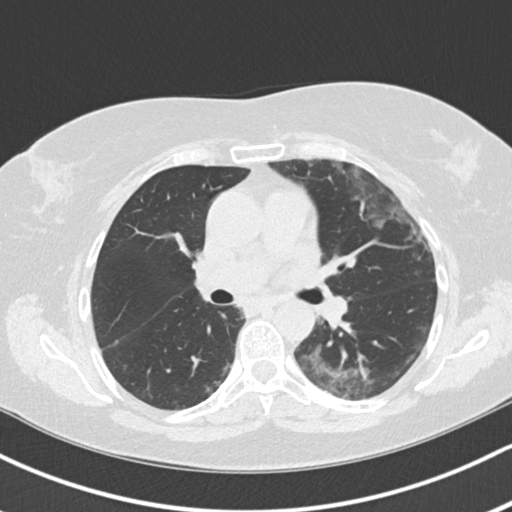
[im 102/150  lung]
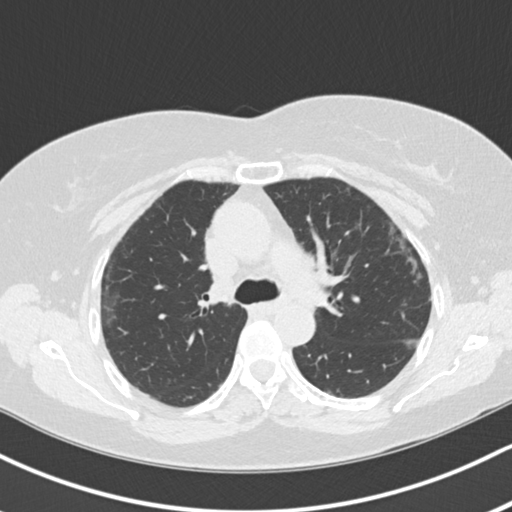
[im 110/150  mediastinal]
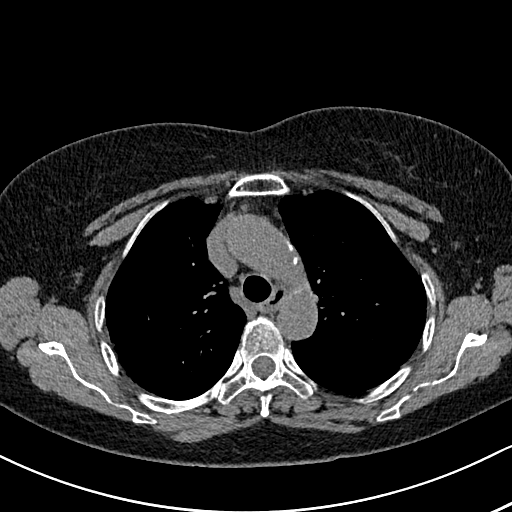
[im 110/150  lung]
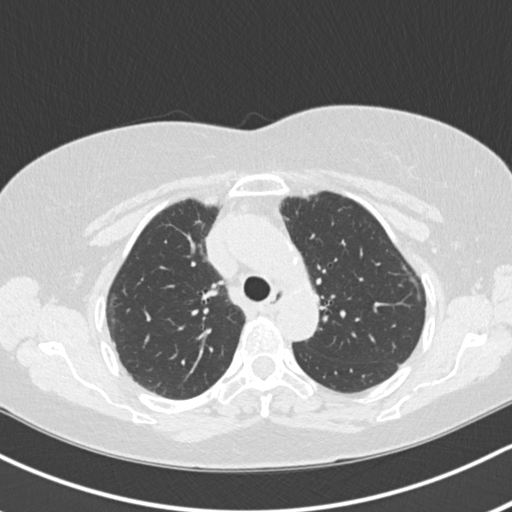
[im 126/150  lung]
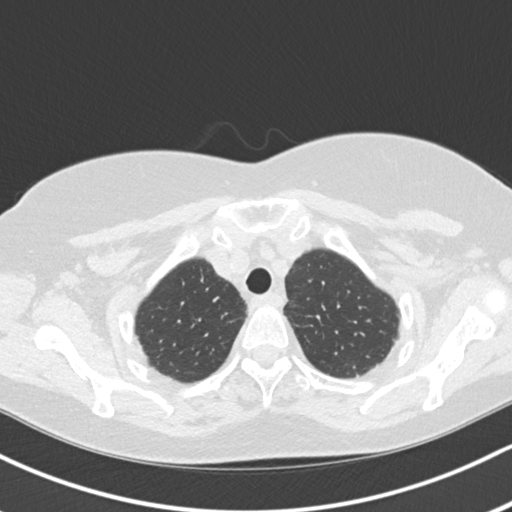
[im 142/150  lung]
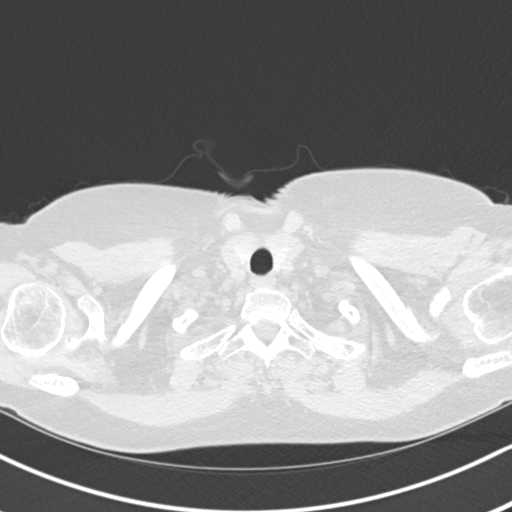

[Series 8: coronal · coronal · 0.59mm/px · 3 of 119 slices shown]
[im 24/119  lung]
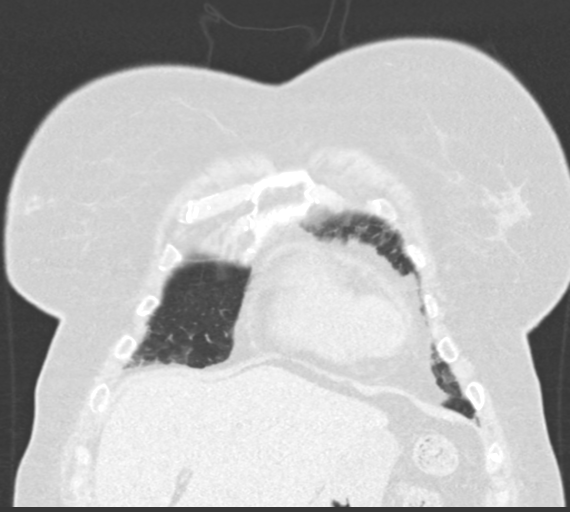
[im 48/119  lung]
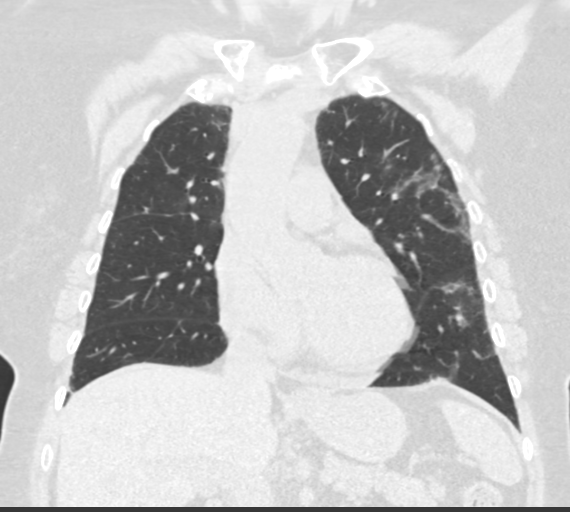
[im 71/119  lung]
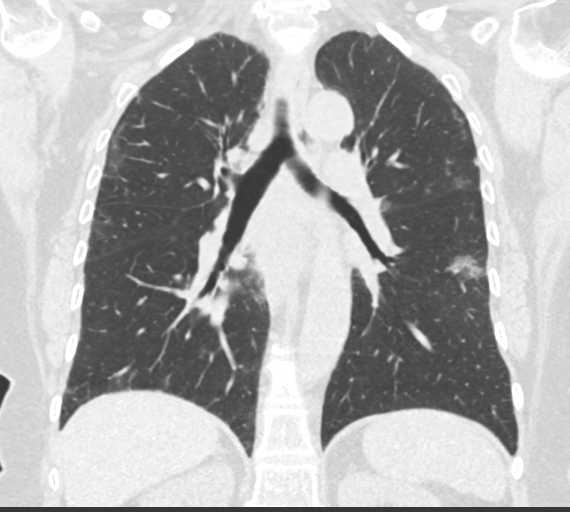

[14 of 36 positions shown; findings below may reference images not displayed]

FINDINGS: Mediastinum/Lymph Nodes: Heart size is normal. There is no
significant pericardial fluid, thickening or pericardial
calcification. There is aortic atherosclerosis, as well as
atherosclerosis of the great vessels of the mediastinum and the
coronary arteries, including calcified atherosclerotic plaque in the
left anterior descending, left circumflex and right coronary
arteries. No pathologically enlarged mediastinal or hilar lymph
nodes. Please note that accurate exclusion of hilar adenopathy is
limited on noncontrast CT scans. Esophagus is unremarkable in
appearance. No axillary lymphadenopathy.

Lungs/Pleura: Compared to the prior examination from 07/25/2015
there is more extensive patchy areas of ground-glass attenuation
throughout the lungs bilaterally (Left greater than right). However,
several of the areas of ground-glass attenuation seen on the prior
examination, including the measured perihilar region on the prior
study in the left upper lobe, have either completely resolved or
nearly resolved. The majority of the patchy areas of ground-glass
attenuation are noted in the periphery of the lungs on today's
examination, and there is notable subpleural sparing in most
regions. Minimal septal thickening is identified. On sagittal image
97 of series 9 there is more dense ground-glass attenuation in the
periphery of an otherwise more lucent region of ground-glass
attenuation (so-called, "atoll sign"). No significant subpleural
reticulation. No traction bronchiectasis. No honeycombing. No
pleural effusions. No definite suspicious appearing pulmonary
nodules or masses. Inspiratory and expiratory imaging is
unremarkable.

Upper abdomen: Unremarkable.

Musculoskeletal: There are no aggressive appearing lytic or blastic
lesions noted in the visualized portions of the skeleton.
IMPRESSION: 1. There is a spectrum of findings in the lungs concerning for
probable cryptogenic organizing pneumonia (COP), as above.
2. Aortic atherosclerosis, in addition to 3 vessel coronary artery
disease. Please note that although the presence of coronary artery
calcium documents the presence of coronary artery disease, the
severity of this disease and any potential stenosis cannot be
assessed on this non-gated CT examination. Assessment for potential
risk factor modification, dietary therapy or pharmacologic therapy
may be warranted, if clinically indicated.

## 2018-01-14 ENCOUNTER — Other Ambulatory Visit: Payer: Self-pay | Admitting: Neurology

## 2018-01-14 DIAGNOSIS — R413 Other amnesia: Secondary | ICD-10-CM

## 2018-02-17 DIAGNOSIS — Z79899 Other long term (current) drug therapy: Secondary | ICD-10-CM | POA: Diagnosis not present

## 2018-02-17 DIAGNOSIS — M810 Age-related osteoporosis without current pathological fracture: Secondary | ICD-10-CM | POA: Diagnosis not present

## 2018-03-04 DIAGNOSIS — J069 Acute upper respiratory infection, unspecified: Secondary | ICD-10-CM | POA: Diagnosis not present

## 2018-03-11 DIAGNOSIS — M810 Age-related osteoporosis without current pathological fracture: Secondary | ICD-10-CM | POA: Diagnosis not present

## 2018-03-16 ENCOUNTER — Encounter: Payer: Self-pay | Admitting: Neurology

## 2018-03-16 ENCOUNTER — Ambulatory Visit: Payer: Medicare Other | Admitting: Neurology

## 2018-03-16 VITALS — BP 135/77 | HR 77 | Ht 65.0 in | Wt 192.0 lb

## 2018-03-16 DIAGNOSIS — R413 Other amnesia: Secondary | ICD-10-CM

## 2018-03-16 NOTE — Progress Notes (Signed)
Reason for visit: Memory disorder  Mariah Stevenson is an 72 y.o. female  History of present illness:  Mariah Stevenson is a 72 year old right-handed white female who has a history of depression and a history of some memory issues.  The patient indicates that she will have good days and bad days with the memory.  She continues to have ongoing fatigue issues, she has a lot of difficulty getting up out of bed in the morning, she will sleep 10 hours at night but still feels tired.  The patient does believe that she snores some at night.  The patient is on clonazepam taking 2 mg twice daily usually.  The patient feels that her depression has improved some.  The patient takes the clonazepam for tremors, not for anxiety.  The patient more recently has noted some problems with gait instability, she may stagger but she has not yet fallen.  She comes back to this office for an evaluation.  She was not able to get the neuropsychological evaluation as the neuropsychologist has left and they canceled her appointment.    Past Medical History:  Diagnosis Date  . Altered awareness, transient   . Anxiety   . Depression   . Headache(784.0)   . Memory deficits 12/10/2012  . Seasonal allergies   . Sinus complaint     Past Surgical History:  Procedure Laterality Date  . TUBAL LIGATION      Family History  Problem Relation Age of Onset  . Stroke Father   . Emphysema Mother        smoked  . Asthma Grandchild   . Rectal cancer Son   . Liver cancer Son     Social history:  reports that she quit smoking about 24 years ago. Her smoking use included cigarettes. She has a 30.00 pack-year smoking history. She has never used smokeless tobacco. She reports current alcohol use. She reports that she does not use drugs.    Allergies  Allergen Reactions  . Sulfur Other (See Comments)    seizures  . Penicillins Rash    thrush in throat  . Lithium     tremors  . Prednisone     psychosis  . Primidone      Hallucinations    Medications:  Prior to Admission medications   Medication Sig Start Date End Date Taking? Authorizing Provider  aspirin 81 MG tablet Take 81 mg by mouth daily.   Yes [provider]  atorvastatin (LIPITOR) 20 MG tablet Take 1 tablet by mouth daily. 07/18/16  Yes [provider]  BIOTIN PO Take 1 capsule by mouth daily.   Yes [provider]  buPROPion (WELLBUTRIN XL) 150 MG 24 hr tablet Take 3 tablets po daily   Yes [provider]  Cholecalciferol (VITAMIN D3 PO) Take 2,000 Units by mouth daily.   Yes [provider]  clonazePAM (KLONOPIN) 2 MG tablet Take 2 mg by mouth 3 (three) times daily as needed for anxiety.    Yes [provider]  clopidogrel (PLAVIX) 75 MG tablet Take 75 mg by mouth daily.   Yes [provider]  famotidine (PEPCID) 20 MG tablet One at bedtime 01/13/17  Yes Nyoka Cowden, MD  FLUoxetine (PROZAC) 20 MG capsule Take 60 mg by mouth daily.  07/30/16  Yes [provider]  fluticasone (FLONASE) 50 MCG/ACT nasal spray Place 2 sprays into both nostrils daily. 12/12/16  Yes [provider]  gabapentin (NEURONTIN) 100 MG capsule  TAKE 1 CAPSULE BY MOUTH 3 TIMES DAILY Patient taking differently: TAKE 1 CAPSULE BY MOUTH 2 TIMES DAILY 10/09/17  Yes Nyoka CowdenWert, Michael B, MD  montelukast (SINGULAIR) 10 MG tablet Take 1 tablet (10 mg total) by mouth at bedtime. 07/07/17  Yes Nyoka CowdenWert, Michael B, MD  Multiple Vitamin (MULTIVITAMIN) capsule Take 1 capsule by mouth daily.   Yes [provider]  omeprazole (PRILOSEC) 40 MG capsule Take 40 mg by mouth daily.   Yes [provider]    ROS:  Out of a complete 14 system review of symptoms, the patient complains only of the following symptoms, and all other reviewed systems are negative.  Memory problems Fatigue  Blood pressure 135/77, pulse 77, height 5\' 5"  (1.651 m), weight 192 lb (87.1 kg).  Physical Exam  General: The  patient is alert and cooperative at the time of the examination.  Skin: No significant peripheral edema is noted.   Neurologic Exam  Mental status: The patient is alert and oriented x 3 at the time of the examination. The patient has apparent normal recent and remote memory, with an apparently normal attention span and concentration ability.  Mini-Mental status examination done today shows a total score 28/30.   Cranial nerves: Facial symmetry is present. Speech is normal, no aphasia or dysarthria is noted. Extraocular movements are full. Visual fields are full.  Motor: The patient has good strength in all 4 extremities.  Sensory examination: Soft touch sensation is symmetric on the face, arms, and legs.  Coordination: The patient has good finger-nose-finger and heel-to-shin bilaterally.  Gait and station: The patient has a slightly unsteady gait, difficulty with turns.  The patient is able to perform tandem gait but Romberg is unsteady, the patient has a tendency to fall backwards.  Reflexes: Deep tendon reflexes are symmetric.   MRI brain 11/05/17:  IMPRESSION:   MRI brain (without) demonstrating: - Stable, scattered periventricular and subcortical and pontine chronic small vessel ischemic disease. - No acute findings.  * MRI scan images were reviewed online. I agree with the written report.    Assessment/Plan:  1.  Memory disorder  2.  History of depression  3.  Gait disorder  4.  Essential tremor  The patient is taking clonazepam for the essential tremor, her mother also had tremor.  Unfortunately, the clonazepam may worsen depression, it can impair cognitive functioning, and can induce gait instability.  I have recommended that the patient cut back on the medication to 1 mg 3 times a day for 4 weeks and then go to 1 mg twice daily.  The patient will be set up again for neurocognitive evaluation.  She will follow-up here in 6 months.  MRI of the brain did show some  mild small vessel disease.  Mariah Stevenson. Keith Kyser Wandel MD 03/16/2018 8:45 AM  [facility] [address] [place], [place] [id number]  Phone [phone] Fax [phone]

## 2018-03-16 NOTE — Patient Instructions (Signed)
Reduce the clonazepam to 1/2 tablet three times a day for 4 weeks, then take 1/2 tablet twice a day.

## 2018-03-30 ENCOUNTER — Other Ambulatory Visit: Payer: Self-pay | Admitting: Pharmacist

## 2018-03-30 NOTE — Patient Outreach (Signed)
[facility] Care Management  03/30/2018  Mariah HumblesDorothy T Stevenson 10/12/1945 [id number]   Incoming call from Mariah Humblesorothy T Pha in response to the [facility] Med[facility]herence [facility]. Speak with patient. HIPAA identifiers verified and verbal consent received.  Mariah Stevenson reports that she is currently taking her atorvastatin 20 mg once daily as directed. Reports that earlier in the year, she stopped the medication for one month as directed by her doctor. However, reports that they decided to have her restart taking it. Reports that she was just about to pick up a refill of this medication from her pharmacy that she had already called in.  Patient denies further medication questions/concerns at this time.  PLAN  1) Will call patient's pharmacy to confirm that the atorvastatin is ready for her to pick up.  2) Will then plan to close pharmacy episode.  Duanne MoronElisabeth Tramain Gershman, PharmD, [facility] Clinical Pharmacist [facility] Care Management [phone]

## 2018-03-30 NOTE — Patient Outreach (Signed)
[facility] Care Management  03/30/2018  Mariah HumblesDorothy T Stevenson 1946/03/12 [id number]   Outreach call to Mariah Humblesorothy T Micucci regarding her request for follow up from the [facility] Med[facility]herence [facility]. Patient contacted by campaign regarding adherence to atorvastatin. Left a HIPAA compliant message on the patient's voicemail.  Duanne MoronElisabeth Micaila Ziemba, PharmD, [facility] Clinical Pharmacist [facility] Care Management [phone]

## 2018-03-30 NOTE — Patient Outreach (Signed)
[facility] Care Management  03/30/2018  Richrd HumblesDorothy T Packard 09/25/1945 [id number]   Call patient's pharmacy, [facility], and speak with Jessi. Jessi reports that Ms. Buford Dresserennington last had her atorvastatin prescription refilled on 02/24/18. Request that the prescription be refilled for the patient today.  Duanne MoronElisabeth Jett Fukuda, PharmD, [facility] Clinical Pharmacist [facility] Care Management [phone]

## 2018-04-03 DIAGNOSIS — H52222 Regular astigmatism, left eye: Secondary | ICD-10-CM | POA: Diagnosis not present

## 2018-04-03 DIAGNOSIS — H43813 Vitreous degeneration, bilateral: Secondary | ICD-10-CM | POA: Diagnosis not present

## 2018-04-03 DIAGNOSIS — Z961 Presence of intraocular lens: Secondary | ICD-10-CM | POA: Diagnosis not present

## 2018-04-03 DIAGNOSIS — H43819 Vitreous degeneration, unspecified eye: Secondary | ICD-10-CM | POA: Diagnosis not present

## 2018-04-10 ENCOUNTER — Encounter: Payer: Self-pay | Admitting: Internal Medicine

## 2018-04-10 ENCOUNTER — Ambulatory Visit: Payer: Medicare Other | Admitting: Internal Medicine

## 2018-04-10 VITALS — BP 112/62 | HR 72 | Ht 66.0 in | Wt 190.0 lb

## 2018-04-10 DIAGNOSIS — J45991 Cough variant asthma: Secondary | ICD-10-CM | POA: Diagnosis not present

## 2018-04-10 NOTE — Progress Notes (Signed)
Subjective:    Patient ID: Mariah Stevenson, female   DOB: 05/10/1945    MRN: [id number]    Brief patient profile:  72 yowf quit smoking in 1995 with a tendency then to recurrent  bronchitis which resolved completely and did fine until winter of 2016 and since then recurrent pattern of episodic cough esp  since winter 2017 but  even between bouts did ok until off maint rx until late spring/early summer 2017    then   needed maint for the first time = symbicort 160 2bid and referred to pulmonary clinic 10/05/2015 by Dr Izola Price for f/u of abn CT chest from 07/25/15 with a nl baseline cxr at that point with evidence of BOOP on HRCT 10/09/15    History of Present Illness  10/05/2015 1st [facility] office visit/ Mariah Stevenson   Chief Complaint  Patient presents with  . Pulmonary Consult    Referred by Dr Carlyon Shadow for eval of cough and pulmonary nodules. She c/o cough off and on since Jan 2017. Her cough has been better here recently, and is occ prod with clear sputum.    2 rounds of prednisone this year and did feel better and last used it mid May 2017  New doe = MMRC1 = can walk nl pace, flat grade, can't hurry or go uphills or steps s sob   Mucus clear/ cough since last round of prednisone but throat feels dry/ tight all the time but not while sleeping  rec Try symbicort 80 Take 2 puffs first thing in am and then another 2 puffs about 12 hours later.  Only use your albuterol as a rescue medication  Work on inhaler technique:   Change prilosec to Take 30-60 min before first meal of the day  GERD  Diet     07/07/2017  f/u ov/Mariah Stevenson re:  Cough x at least 2013 - best rx is gabapentin  Chief Complaint  Patient presents with  . Follow-up    states she does not see a significant change with the gabapentin  Dyspnea:  Not limited by breathing from desired activities   Cough: more x one month but min prod mucoid, esp worse first in am but does  not wake her / concerned about variable hoarsensess since  started symbicort  Sleep: ok SABA use: never Not using supper time dosing of meds as instructed but now denies p supper cough as a problem vs prior ov rec Singulair 10 mg one each pm and try off symbicort if you can - if not resume the am dose and add pm dose if still not better Please schedule a follow up visit in 3 months but call sooner if needed  - add next step MCT then refer to [facility] / voice center     10/08/2017  f/u ov/Mariah Stevenson re: cough since 2013/ best rx to date = gabapentin but poorly tol daytime dosing  Chief Complaint  Patient presents with  . Follow-up    Reports her cough has improved. Has intermitten hoarseness but much better.   Dyspnea:  Not limited by breathing from desired activities   Cough: better, notes sometimes in pm  SABA use: none 02: none   rec Try off symbicort to see if you notice any difference and if worse then resume it.  After another few weeks, stop am gabapentin to see if mentally sharper without it    01/08/2018  f/u ov/Mariah Stevenson re:  Cough since 2013   / better than it's  ever been on singulair/ on gabapentin 100 bid / no inhalers at all Chief Complaint  Patient presents with  . Follow-up    Cough has improved some. No new co's.    Dyspnea:  Walking the neighborhood no problem Cough: no pattern / nasal drainage /congestion symptoms worse off lonase  Sleeping: flat / one pillow on side  SABA use: none , no inhalers  02: no 02  rec Ok to try flonase up to 2 pffs every 12 hours to see if it helps/ itching/ sneezing  I emphasized that nasal steroids(like flonase)  have no immediate benefit  Try taking one extra gabapentin during the day if needed for control of the throat clearing.  Propranolol (Inderol) is an option for tremor now that we're pretty sure asthma is not your problem - discuss this with Dr Anne HahnWillis at your next visit if you're not happy with clonazepam     04/10/2018  f/u ov/Mariah Stevenson re: uacs x 2013 still well controlled, nasal symptoms  variably worse few a days a month Chief Complaint  Patient presents with  . Follow-up    She is not coughing at this time.    Dyspnea:  Not limited by breathing from desired activities   Cough: gone/ still some intermittent nasal drainage better on flonaase Sleeping:  Well flat  SABA use: none Willis working on balance issues    No obvious day to day or daytime variability or assoc excess/ purulent sputum or mucus plugs or hemoptysis or cp or chest tightness, subjective wheeze or overt   hb symptoms.    Sleeping as above  without nocturnal  or early am exacerbation  of respiratory  c/o's or need for noct saba. Also denies any obvious fluctuation of symptoms with weather or environmental changes or other aggravating or alleviating factors except as outlined above   No unusual exposure hx or h/o childhood pna/ asthma or knowledge of premature birth.  Current Allergies, Complete Past Medical History, Past Surgical History, Family History, and Social History were [facility].  ROS  The following are not active complaints unless bolded Hoarseness, sore throat, dysphagia, dental problems, itching, sneezing,  nasal congestion or discharge of excess mucus or purulent secretions, ear ache,   fever, chills, sweats, unintended wt loss or wt gain, classically pleuritic or exertional cp,  orthopnea pnd or arm/hand swelling  or leg swelling, presyncope, palpitations, abdominal pain, anorexia, nausea, vomiting, diarrhea  or change in bowel habits or change in bladder habits, change in stools or change in urine, dysuria, hematuria,  rash, arthralgias, visual complaints, headache, numbness, weakness or ataxia or problems with walking or coordination,  change in mood or  memory.        Current Meds  Medication Sig  . aspirin 81 MG tablet Take 81 mg by mouth daily.  Marland Kitchen. atorvastatin (LIPITOR) 20 MG tablet Take 1 tablet by mouth daily.  Marland Kitchen. BIOTIN PO Take 1 capsule by  mouth daily.  Marland Kitchen. buPROPion (WELLBUTRIN XL) 150 MG 24 hr tablet Take 3 tablets po daily  . Cholecalciferol (VITAMIN D3 PO) Take 2,000 Units by mouth daily.  . clonazePAM (KLONOPIN) 2 MG tablet Take 2 mg by mouth 3 (three) times daily as needed for anxiety.   . clopidogrel (PLAVIX) 75 MG tablet Take 75 mg by mouth daily.  . famotidine (PEPCID) 20 MG tablet One at bedtime  . FLUoxetine (PROZAC) 20 MG capsule Take 60 mg by mouth daily.   . fluticasone (  FLONASE) 50 MCG/ACT nasal spray Place 2 sprays into both nostrils daily.  Marland Kitchen gabapentin (NEURONTIN) 100 MG capsule TAKE 1 CAPSULE BY MOUTH 3 TIMES DAILY (Patient taking differently: TAKE 1 CAPSULE BY MOUTH 2 TIMES DAILY)  . montelukast (SINGULAIR) 10 MG tablet Take 1 tablet (10 mg total) by mouth at bedtime.  . Multiple Vitamin (MULTIVITAMIN) capsule Take 1 capsule by mouth daily.  Marland Kitchen omeprazole (PRILOSEC) 40 MG capsule Take 40 mg by mouth daily.              Objective:   Physical Exam     amb wf  -  No longer throat clearing    04/10/2018        190  01/08/2018      187  10/08/2017        192  07/07/2017          195  04/07/2017          204  02/24/2017      201  01/13/2017      203  12/24/2016        203  09/23/2016        198  08/12/2016        198  07/15/2016       199  01/16/2016     195  12/05/2015          195   10/24/2015       193   10/05/15 190 lb 9.6 oz (86.456 kg)  12/06/14 202 lb (91.627 kg)  06/21/13 202 lb (91.627 kg)    Vital signs reviewed - Note on arrival 02 sats  99% on RA      HEENT: nl dentition, turbinates bilaterally, and oropharynx. Nl external ear canals without cough reflex   NECK :  without JVD/Nodes/TM/ nl carotid upstrokes bilaterally   LUNGS: no acc muscle use,  Nl contour chest which is clear to A and P bilaterally without cough on insp or exp maneuvers   CV:  RRR  no s3 or murmur or increase in P2, and no edema   ABD:  soft and nontender with nl inspiratory excursion in the supine position. No bruits  or organomegaly appreciated, bowel sounds nl  MS:  Nl gait/ ext warm without deformities, calf tenderness, cyanosis or clubbing No obvious joint restrictions   SKIN: warm and dry without lesions    NEURO:  alert, approp, nl sensorium with  no motor or cerebellar deficits apparent.              Assessment:

## 2018-04-10 NOTE — Assessment & Plan Note (Signed)
Onset 2013  - Spirometry 10/05/2015  FEV1 2.00 (81%)  Ratio 88 p am symb 160 > change to 80 2bid  - Allergy profile 01/16/16 >  Eos 0.0/  IgE  67 neg RAST  - PFT's 01/16/2016 no airflow obstruction  - Sinus CT 08/19/2016 >>> Unchanged small left sphenoid sinus mucous retention cyst, otherwise clear sinuses. - gabapentin 100 tid 08/19/2016 >>> improved but  Could not tol 09/23/2016 so rec bid or 2 hs dosing - added fosfamax late spring / early summer 2018  - 12/24/2016 trial off symbicort and continue gabapentin 100 bid (instead of 2 q am) > worse w/in  One week - 01/13/2017 added h1 and d/c'ed  fosfamax - 01/13/2017  After extensive coaching HFA effectiveness =    90%  - 04/07/2017 rec rechallenge with gabapentin 100 tid and extra chlorpheniramine at supper> did neither of these recs - 07/07/2017 trial of singulair since hoarse on symb 80 > improved 10/08/2017 so rec wean off symb then try off am dose of gabapentin if tol  > improved further 01/08/2018 off all inhalers   - 04/10/2018 maintaining good control off all inhalers and just on gabapentin but has developed ? Cns side effects so ok to try off    Cough has resolved to her satisfaction but may be having cns effects   Ok to first try 1st gen H1 blockers per guidelines  If tolerates to see if any benefit or additional cns concerns and if not then d/c the gabapentin to see if can control on just h1 blockers/ tolerate them and if not consider referral to [facility] voice center  F/u here in 3 m, sooner if needed    I had an extended discussion with the patient reviewing all relevant studies completed to date and  lasting 10 minutes of a 15 minute visit    Each maintenance medication was reviewed in detail including most importantly the difference between maintenance and prns and under what circumstances the prns are to be triggered using an action plan format that is not reflected in the computer generated alphabetically organized AVS.     Please see AVS  for specific instructions unique to this visit that I personally wrote and verbalized to the the pt in detail and then reviewed with pt  by my nurse highlighting any  changes in therapy recommended at today's visit to their plan of care.

## 2018-04-10 NOTE — Patient Instructions (Addendum)
For drainage / throat tickle try take CHLORPHENIRAMINE  4 mg (chlortab 4 mg) - take one every 4 hours as needed - available over the counter- may cause drowsiness so start with just a bedtime dose or two and see how you tolerate it before trying in daytime     If cough/ drainage are gone for a week ok to try off the gabapentin.    Please schedule a follow up visit in 3 months but call sooner if needed

## 2018-04-14 DIAGNOSIS — M810 Age-related osteoporosis without current pathological fracture: Secondary | ICD-10-CM | POA: Diagnosis not present

## 2018-04-23 ENCOUNTER — Encounter: Payer: Medicare Other | Admitting: Psychology

## 2018-05-12 ENCOUNTER — Encounter: Payer: Medicare Other | Admitting: Psychology

## 2018-05-20 DIAGNOSIS — F331 Major depressive disorder, recurrent, moderate: Secondary | ICD-10-CM | POA: Diagnosis not present

## 2018-05-20 DIAGNOSIS — R413 Other amnesia: Secondary | ICD-10-CM | POA: Diagnosis not present

## 2018-05-20 DIAGNOSIS — F411 Generalized anxiety disorder: Secondary | ICD-10-CM | POA: Diagnosis not present

## 2018-05-20 DIAGNOSIS — R41844 Frontal lobe and executive function deficit: Secondary | ICD-10-CM | POA: Diagnosis not present

## 2018-06-11 ENCOUNTER — Telehealth: Payer: Self-pay | Admitting: Neurology

## 2018-06-11 NOTE — Telephone Encounter (Signed)
Patient has completed her neuropsychological evaluation, done on 20 May 2018.  The test results showed some deficits with executive cognitive processes such as processing speed, problem solving, complex divided attention, and organization and planning activities.  Learning and memory are also generally impaired.  Language function was as expected with reasonable attention, working memory and Nature conservation officer.  There appears to be a moderate level of underlying depression and mild anxiety.  It is felt that the patient has moderate cognitive deficits involving executive cognitive processes, some underlying depression was noted.

## 2018-06-25 DIAGNOSIS — F322 Major depressive disorder, single episode, severe without psychotic features: Secondary | ICD-10-CM | POA: Diagnosis not present

## 2018-06-25 DIAGNOSIS — I2510 Atherosclerotic heart disease of native coronary artery without angina pectoris: Secondary | ICD-10-CM | POA: Diagnosis not present

## 2018-06-25 DIAGNOSIS — M810 Age-related osteoporosis without current pathological fracture: Secondary | ICD-10-CM | POA: Diagnosis not present

## 2018-06-25 DIAGNOSIS — E7800 Pure hypercholesterolemia, unspecified: Secondary | ICD-10-CM | POA: Diagnosis not present

## 2018-07-13 ENCOUNTER — Ambulatory Visit: Payer: Medicare Other | Admitting: Internal Medicine

## 2018-08-04 ENCOUNTER — Other Ambulatory Visit: Payer: Self-pay | Admitting: Internal Medicine

## 2018-08-04 DIAGNOSIS — J45991 Cough variant asthma: Secondary | ICD-10-CM

## 2018-08-05 DIAGNOSIS — W19XXXA Unspecified fall, initial encounter: Secondary | ICD-10-CM | POA: Diagnosis not present

## 2018-08-05 DIAGNOSIS — S52592A Other fractures of lower end of left radius, initial encounter for closed fracture: Secondary | ICD-10-CM | POA: Diagnosis not present

## 2018-08-05 DIAGNOSIS — M25532 Pain in left wrist: Secondary | ICD-10-CM | POA: Diagnosis not present

## 2018-08-06 DIAGNOSIS — R52 Pain, unspecified: Secondary | ICD-10-CM | POA: Diagnosis not present

## 2018-08-06 DIAGNOSIS — S52502A Unspecified fracture of the lower end of left radius, initial encounter for closed fracture: Secondary | ICD-10-CM | POA: Diagnosis not present

## 2018-08-19 DIAGNOSIS — S52502D Unspecified fracture of the lower end of left radius, subsequent encounter for closed fracture with routine healing: Secondary | ICD-10-CM | POA: Diagnosis not present

## 2018-08-19 DIAGNOSIS — Z4789 Encounter for other orthopedic aftercare: Secondary | ICD-10-CM | POA: Diagnosis not present

## 2018-08-27 DIAGNOSIS — S52502A Unspecified fracture of the lower end of left radius, initial encounter for closed fracture: Secondary | ICD-10-CM | POA: Diagnosis not present

## 2018-09-02 DIAGNOSIS — R29898 Other symptoms and signs involving the musculoskeletal system: Secondary | ICD-10-CM | POA: Diagnosis not present

## 2018-09-02 DIAGNOSIS — S52502D Unspecified fracture of the lower end of left radius, subsequent encounter for closed fracture with routine healing: Secondary | ICD-10-CM | POA: Diagnosis not present

## 2018-09-02 DIAGNOSIS — M6281 Muscle weakness (generalized): Secondary | ICD-10-CM | POA: Diagnosis not present

## 2018-09-14 ENCOUNTER — Telehealth: Payer: Self-pay | Admitting: *Deleted

## 2018-09-14 NOTE — Telephone Encounter (Signed)
Called pt. Due to current COVID 19 pandemic, our office is severely reducing in office visits until further notice, in order to minimize the risk to our patients and healthcare providers. Unable to get in contact with the patient to convert their office visit with [name] on 09/15/2018 into a doxy.me visit or mychart VV. I left a voicemail asking the patient to return my call. Office number was provided.     If patient calls back please convert their office visit into a doxy.me visit ot mychart VV.

## 2018-09-15 ENCOUNTER — Ambulatory Visit: Payer: Medicare Other | Admitting: Neurology

## 2018-10-06 ENCOUNTER — Ambulatory Visit: Payer: Medicare Other | Admitting: Internal Medicine

## 2018-10-15 DIAGNOSIS — Z1159 Encounter for screening for other viral diseases: Secondary | ICD-10-CM | POA: Diagnosis not present

## 2018-10-22 ENCOUNTER — Telehealth: Payer: Self-pay | Admitting: Neurology

## 2018-10-22 MED ORDER — CLONAZEPAM 2 MG PO TABS: 2.0000 mg | ORAL_TABLET | Freq: Two times a day (BID) | ORAL | 1 refills | Status: DC

## 2018-10-22 NOTE — Addendum Note (Signed)
Addended by: Kathrynn Ducking on: 10/22/2018 01:45 PM   Modules accepted: Orders

## 2018-10-22 NOTE — Telephone Encounter (Signed)
Pt schedule with [name] NP.

## 2018-10-22 NOTE — Telephone Encounter (Signed)
Pt is asking for a call from Dr Jannifer Franklin to discuss the results from the neuropsychological evaluation from 02-19 of this year

## 2018-10-22 NOTE — Telephone Encounter (Signed)
I called the patient.  The patient has had neuropsychological evaluation that suggested some cognitive functioning problems, there is some underlying depression.  The patient is on clonazepam for tremor, her primary doctor told her that our office needs to continue to write this prescription.  I will send in a prescription for the medication.  The patient may be candidate for Aricept or other medications for memory in the future.  She does not have a revisit schedule, I will try to get something set up.

## 2018-11-16 ENCOUNTER — Other Ambulatory Visit: Payer: Self-pay | Admitting: Internal Medicine

## 2018-11-16 DIAGNOSIS — J45991 Cough variant asthma: Secondary | ICD-10-CM

## 2019-01-24 NOTE — Progress Notes (Signed)
PATIENT: Mariah Stevenson DOB: November 30, 1945  REASON FOR VISIT: follow up HISTORY FROM: patient  HISTORY OF PRESENT ILLNESS: Today 01/25/19  Mariah Stevenson is a 73 year old female with history of depression and memory trouble.  She had a neuropsychological evaluation in February 2019 that suggested some cognitive functioning problems, along with underlying depression.  She is taking clonazepam for her tremor. She had MRI of the brain in 2019, that was unchanged from 2016 showing chronic small vessel ischemic disease. EEG was normal in 2019.  She lives with her daughter.  She continues to report trouble with her memory.  She is able to perform all of her own ADLs.  She does drive a car.  She continues to report trouble with balance and gait instability.  She has a chronic tremor in her hands, the left is worse than the right.  For the last several months, she has not been exercising like she should.  In general she feels weaker, and does not feel like doing much.  She says her depression is under good control.  She is no longer seeing a psychiatrist.  Since last seen, she has had 2 falls, losing her balance, and feeling like her legs are giving out.  She presents today for follow-up accompanied by her daughter.  HISTORY 03/16/2018 Dr. Anne Stevenson: Mariah Stevenson is a 73 year old right-handed white female who has a history of depression and a history of some memory issues.  The patient indicates that she will have good days and bad days with the memory.  She continues to have ongoing fatigue issues, she has a lot of difficulty getting up out of bed in the morning, she will sleep 10 hours at night but still feels tired.  The patient does believe that she snores some at night.  The patient is on clonazepam taking 2 mg twice daily usually.  The patient feels that her depression has improved some.  The patient takes the clonazepam for tremors, not for anxiety.  The patient more recently has noted some problems  with gait instability, she may stagger but she has not yet fallen.  She comes back to this office for an evaluation.  She was not able to get the neuropsychological evaluation as the neuropsychologist has left and they canceled her appointment.   REVIEW OF SYSTEMS: Out of a complete 14 system review of symptoms, the patient complains only of the following symptoms, and all other reviewed systems are negative.  Memory loss, depression, gait instability  ALLERGIES: Allergies  Allergen Reactions  . Sulfur Other (See Comments)    seizures  . Penicillins Rash    thrush in throat  . Gabapentin     Could not remember  . Lithium     tremors  . Prednisone     psychosis  . Primidone     Hallucinations    HOME MEDICATIONS: Outpatient Medications Prior to Visit  Medication Sig Dispense Refill  . aspirin 81 MG tablet Take 81 mg by mouth daily.    Marland Kitchen atorvastatin (LIPITOR) 20 MG tablet Take 1 tablet by mouth daily.  1  . BIOTIN PO Take 1 capsule by mouth daily.    Marland Kitchen buPROPion (WELLBUTRIN XL) 150 MG 24 hr tablet Take 3 tablets po daily    . Cholecalciferol (VITAMIN D3 PO) Take 2,000 Units by mouth daily.    . clonazePAM (KLONOPIN) 2 MG tablet Take 1 tablet (2 mg total) by mouth 2 (two) times daily. 180 tablet 1  . clopidogrel (  PLAVIX) 75 MG tablet Take 75 mg by mouth daily.    . famotidine (PEPCID) 20 MG tablet One at bedtime    . FLUoxetine (PROZAC) 20 MG capsule Take 60 mg by mouth daily.   4  . fluticasone (FLONASE) 50 MCG/ACT nasal spray Place 2 sprays into both nostrils daily.  0  . montelukast (SINGULAIR) 10 MG tablet TAKE ONE TABLET BY MOUTH AT BEDTIME 30 tablet 1  . Multiple Vitamin (MULTIVITAMIN) capsule Take 1 capsule by mouth daily.    Marland Kitchen omeprazole (PRILOSEC) 40 MG capsule Take 40 mg by mouth daily.    Marland Kitchen gabapentin (NEURONTIN) 100 MG capsule TAKE 1 CAPSULE BY MOUTH 3 TIMES DAILY (Patient taking differently: TAKE 1 CAPSULE BY MOUTH 2 TIMES DAILY) 60 capsule 11   No  facility-administered medications prior to visit.     PAST MEDICAL HISTORY: Past Medical History:  Diagnosis Date  . Altered awareness, transient   . Anxiety   . Depression   . Headache(784.0)   . Memory deficits 12/10/2012  . Seasonal allergies   . Sinus complaint     PAST SURGICAL HISTORY: Past Surgical History:  Procedure Laterality Date  . TUBAL LIGATION      FAMILY HISTORY: Family History  Problem Relation Age of Onset  . Stroke Father   . Emphysema Mother        smoked  . Asthma Grandchild   . Rectal cancer Son   . Liver cancer Son     SOCIAL HISTORY: Social History   Socioeconomic History  . Marital status: Married    Spouse name: Not on file  . Number of children: 2  . Years of education: 44  . Highest education level: Not on file  Occupational History  . Occupation: Retired    Associate Professor: [employer]  Social Needs  . Financial resource strain: Not on file  . Food insecurity    Worry: Not on file    Inability: Not on file  . Transportation needs    Medical: Not on file    Non-medical: Not on file  Tobacco Use  . Smoking status: Former Smoker    Packs/day: 1.00    Years: 30.00    Pack years: 30.00    Types: Cigarettes    Quit date: 04/01/1993    Years since quitting: 25.8  . Smokeless tobacco: Never Used  Substance and Sexual Activity  . Alcohol use: Yes    Alcohol/week: 0.0 standard drinks    Comment: occas. wine  . Drug use: No  . Sexual activity: Not on file  Lifestyle  . Physical activity    Days per week: Not on file    Minutes per session: Not on file  . Stress: Not on file  Relationships  . Social Musician on phone: Not on file    Gets together: Not on file    Attends religious service: Not on file    Active member of club or organization: Not on file    Attends meetings of clubs or organizations: Not on file    Relationship status: Not on file  . Intimate partner violence    Fear of current or ex partner: Not on file     Emotionally abused: Not on file    Physically abused: Not on file    Forced sexual activity: Not on file  Other Topics Concern  . Not on file  Social History Narrative   Lives with daughter   Caffeine use: 2-3  cups daily   Right handed     PHYSICAL EXAM  Vitals:   01/25/19 1058  BP: 115/79  Pulse: 65  Temp: 98 F (36.7 C)  Weight: 187 lb 12.8 oz (85.2 kg)   Body mass index is 30.31 kg/m.  Generalized: Well developed, in no acute distress  MMSE - Mini Mental State Exam 01/25/2019 03/16/2018 10/23/2017  Orientation to time 5 5 5   Orientation to Place 5 5 5   Registration 3 3 3   Attention/ Calculation 5 5 5   Recall 2 2 2   Language- name 2 objects 2 2 2   Language- repeat 1 1 1   Language- follow 3 step command 3 3 3   Language- read & follow direction 1 1 1   Write a sentence 1 1 1   Copy design 0 0 1  Total score 28 28 29     Neurological examination  Mentation: Alert oriented to time, place, history taking. Follows all commands speech and language fluent Cranial nerve II-XII: Pupils were equal round reactive to light. Extraocular movements were full, visual field were full on confrontational test. Facial sensation and strength were normal.  Head turning and shoulder shrug  were normal and symmetric. Motor: The motor testing reveals 5 over 5 strength of all 4 extremities. Good symmetric motor tone is noted throughout.  Sensory: Sensory testing is intact to soft touch on all 4 extremities. No evidence of extinction is noted.  Coordination: Cerebellar testing reveals good finger-nose-finger and heel-to-shin bilaterally, moderate intention tremor noted to finger-nose-finger bilaterally Gait and station: Able to rise from seated position without pushoff, gait unsteady,  tandem gait was unsteady, Romberg tendency to lean backwards Reflexes: Deep tendon reflexes are symmetric and normal bilaterally.   DIAGNOSTIC DATA (LABS, IMAGING, TESTING) - I reviewed patient records, labs,  notes, testing and imaging myself where available.  Lab Results  Component Value Date   WBC 12.0 (H) 07/15/2016   HGB 11.7 (L) 07/15/2016   HCT 35.9 (L) 07/15/2016   MCV 87.2 07/15/2016   PLT 295.0 07/15/2016      Component Value Date/Time   NA 135 10/05/2015 1423   K 4.0 10/05/2015 1423   CL 99 10/05/2015 1423   CO2 27 10/05/2015 1423   GLUCOSE 100 (H) 10/05/2015 1423   BUN 10 10/05/2015 1423   CREATININE 0.70 10/05/2015 1423   CALCIUM 9.5 10/05/2015 1423   No results found for: CHOL, HDL, LDLCALC, LDLDIRECT, TRIG, CHOLHDL No results found for: [id number] Lab Results  Component Value Date   VITAMINB12 >2000 (H) 10/23/2017   Lab Results  Component Value Date   TSH 2.910 10/23/2017    ASSESSMENT AND PLAN 73 y.o. year old female  has a past medical history of Altered awareness, transient, Anxiety, Depression, Headache(784.0), Memory deficits (12/10/2012), Seasonal allergies, and Sinus complaint. here with:  1.  Memory loss 2.  History of depression 3.  Gait disorder 4.  Essential tremor  Her memory score is stable 28/30 today.  She had a neuropsychological evaluation suggesting some cognitive functioning problems, along with underlying depression.  We will give her a trial of Aricept taking 5 mg at bedtime for 1 month, if she tolerates medication we will go up to full dose 10 mg at bedtime.  She will continue taking clonazepam 1 mg twice a day for tremor.  In the past she was taking clonazepam 2 mg twice daily, but her daughter has been cutting the medication (this is a recent change), as Dr. Anne HahnWillis has indicated clonazepam may be  contributing to her depression, cognitive functioning, and gait instability.  I have encouraged her to continue routine follow-up with her primary doctor, monitor report of overall weakness.  I have also encouraged her to try to get involved in consistent exercise. If the gait continues to be an issue, we may consider gait training in the future.  She will  follow-up in 6 months or sooner if needed.  I did advise her symptoms worsen or she develops any new symptoms she should let us know.  I spent 25 minutes with the patient. 50% of this time was spent discussing her plan of care.  Butler Denmark, AGNP-C, DNP 01/25/2019, 11:10 AM [facility] [address] [place], [place] [address] [phone]

## 2019-01-25 ENCOUNTER — Encounter: Payer: Self-pay | Admitting: Neurology

## 2019-01-25 ENCOUNTER — Ambulatory Visit: Payer: Medicare Other | Admitting: Neurology

## 2019-01-25 ENCOUNTER — Other Ambulatory Visit: Payer: Self-pay

## 2019-01-25 VITALS — BP 115/79 | HR 65 | Temp 98.0°F | Wt 187.8 lb

## 2019-01-25 DIAGNOSIS — R413 Other amnesia: Secondary | ICD-10-CM

## 2019-01-25 DIAGNOSIS — G250 Essential tremor: Secondary | ICD-10-CM | POA: Diagnosis not present

## 2019-01-25 DIAGNOSIS — R269 Unspecified abnormalities of gait and mobility: Secondary | ICD-10-CM | POA: Diagnosis not present

## 2019-01-25 MED ORDER — DONEPEZIL HCL 5 MG PO TABS: ORAL_TABLET | ORAL | 0 refills | Status: DC

## 2019-01-25 NOTE — Patient Instructions (Signed)
1. Start Aricept 5 mg at bedtime x 1 month, then call me and let me know how you are doing, if tolerating well, we will go up to 10 mg at bedtime 2. Continue Clonazepam 1 mg twice a day 3. Try to get involved in exercise 4. Return in 6 months

## 2019-01-25 NOTE — Progress Notes (Signed)
I have read the note, and I agree with the clinical assessment and plan.  Elveta Rape K Navil Kole   

## 2019-02-12 DIAGNOSIS — Z0000 Encounter for general adult medical examination without abnormal findings: Secondary | ICD-10-CM | POA: Diagnosis not present

## 2019-02-12 DIAGNOSIS — R2689 Other abnormalities of gait and mobility: Secondary | ICD-10-CM | POA: Diagnosis not present

## 2019-02-12 DIAGNOSIS — Z23 Encounter for immunization: Secondary | ICD-10-CM | POA: Diagnosis not present

## 2019-02-12 DIAGNOSIS — Z13220 Encounter for screening for lipoid disorders: Secondary | ICD-10-CM | POA: Diagnosis not present

## 2019-02-16 DIAGNOSIS — R262 Difficulty in walking, not elsewhere classified: Secondary | ICD-10-CM | POA: Diagnosis not present

## 2019-02-18 DIAGNOSIS — R262 Difficulty in walking, not elsewhere classified: Secondary | ICD-10-CM | POA: Diagnosis not present

## 2019-02-22 ENCOUNTER — Other Ambulatory Visit: Payer: Self-pay | Admitting: Neurology

## 2019-02-27 ENCOUNTER — Other Ambulatory Visit: Payer: Self-pay | Admitting: Internal Medicine

## 2019-02-27 DIAGNOSIS — J45991 Cough variant asthma: Secondary | ICD-10-CM

## 2019-03-02 ENCOUNTER — Telehealth: Payer: Self-pay | Admitting: Neurology

## 2019-03-02 MED ORDER — DONEPEZIL HCL 10 MG PO TABS: 10.0000 mg | ORAL_TABLET | Freq: Every day | ORAL | 1 refills | Status: DC

## 2019-03-02 NOTE — Telephone Encounter (Signed)
Please let her know I will increase the medication to 10 mg at bedtime.

## 2019-03-02 NOTE — Telephone Encounter (Signed)
Patient called to inform that she has finished her prescription donepezil (ARICEPT) 5 MG tablet and was advised to call the office once medication was finished.     Please follow up.

## 2019-03-02 NOTE — Telephone Encounter (Signed)
Unable to get in contact with the patient. LVM letting her know that [name] has sent in a new script for Donepezil 10 mg at bedtime to her pharmacy. Office number was provided in case she has any other questions or concerns.

## 2019-03-04 DIAGNOSIS — R262 Difficulty in walking, not elsewhere classified: Secondary | ICD-10-CM | POA: Diagnosis not present

## 2019-03-09 DIAGNOSIS — R262 Difficulty in walking, not elsewhere classified: Secondary | ICD-10-CM | POA: Diagnosis not present

## 2019-03-16 DIAGNOSIS — R262 Difficulty in walking, not elsewhere classified: Secondary | ICD-10-CM | POA: Diagnosis not present

## 2019-03-17 DIAGNOSIS — M810 Age-related osteoporosis without current pathological fracture: Secondary | ICD-10-CM | POA: Diagnosis not present

## 2019-03-30 ENCOUNTER — Other Ambulatory Visit: Payer: Self-pay | Admitting: Internal Medicine

## 2019-03-30 DIAGNOSIS — J45991 Cough variant asthma: Secondary | ICD-10-CM

## 2019-05-28 ENCOUNTER — Ambulatory Visit: Payer: Medicare HMO | Attending: Internal Medicine

## 2019-05-28 DIAGNOSIS — Z23 Encounter for immunization: Secondary | ICD-10-CM

## 2019-05-28 NOTE — Progress Notes (Signed)
   Covid-19 Vaccination Clinic  Name:  ADLAI NIEBLAS    MRN: [id number] DOB: 03/28/1946  05/28/2019  Ms. Stansbery was observed post Covid-19 immunization for 15 minutes without incidence. She was provided with Vaccine Information Sheet and instruction to access the V-Safe system.   Ms. Marmo was instructed to call 911 with any severe reactions post vaccine: Marland Kitchen Difficulty breathing  . Swelling of your face and throat  . A fast heartbeat  . A bad rash all over your body  . Dizziness and weakness    Immunizations Administered    Name Date Dose VIS Date Route   Pfizer COVID-19 Vaccine 05/28/2019 12:41 PM 0.3 mL 03/12/2019 Intramuscular   Manufacturer: [employer], [employer]   Lot: [id number]   NDC: [address]-2

## 2019-06-22 ENCOUNTER — Ambulatory Visit: Payer: Medicare HMO | Attending: Internal Medicine

## 2019-06-22 DIAGNOSIS — Z23 Encounter for immunization: Secondary | ICD-10-CM

## 2019-06-22 NOTE — Progress Notes (Signed)
   Covid-19 Vaccination Clinic  Name:  Mariah Stevenson    MRN: [id number] DOB: 03/30/1946  06/22/2019  [name] was observed post Covid-19 immunization for 15 minutes without incident. She was provided with Vaccine Information Sheet and instruction to access the V-Safe system.   [name] was instructed to call 911 with any severe reactions post vaccine: Marland Kitchen Difficulty breathing  . Swelling of face and throat  . A fast heartbeat  . A bad rash all over body  . Dizziness and weakness   Immunizations Administered    Name Date Dose VIS Date Route   Pfizer COVID-19 Vaccine 06/22/2019  3:36 PM 0.3 mL 03/12/2019 Intramuscular   Manufacturer: [employer], [employer]   Lot: [id number]   NDC: [address]-4

## 2019-07-27 ENCOUNTER — Telehealth: Payer: Self-pay | Admitting: Neurology

## 2019-07-27 ENCOUNTER — Encounter: Payer: Self-pay | Admitting: Neurology

## 2019-07-27 ENCOUNTER — Ambulatory Visit: Payer: Medicare Other | Admitting: Neurology

## 2019-07-27 NOTE — Telephone Encounter (Signed)
This patient did not show for a revisit appointment today. 

## 2019-09-03 ENCOUNTER — Telehealth: Payer: Self-pay | Admitting: Neurology

## 2019-09-03 NOTE — Telephone Encounter (Signed)
[email or web address] [place], [place] has called for a refill on pt's donepezil (ARICEPT) 5 MG tablet

## 2019-09-06 MED ORDER — DONEPEZIL HCL 10 MG PO TABS: 10.0000 mg | ORAL_TABLET | Freq: Every day | ORAL | 0 refills | Status: DC

## 2019-09-06 NOTE — Telephone Encounter (Signed)
I called and confirmed that was 10mg  tablets and not 5 mg tabls.  I relayed to pharmacy that will refill for 3 months but needs f/u appt. She will relay to pts family.

## 2019-09-06 NOTE — Addendum Note (Signed)
Addended by: Guy Begin on: 09/06/2019 10:12 AM   Modules accepted: Orders

## 2019-09-08 ENCOUNTER — Encounter: Payer: Self-pay | Admitting: Neurology

## 2019-09-08 ENCOUNTER — Ambulatory Visit: Payer: Medicare HMO | Admitting: Neurology

## 2019-09-08 VITALS — BP 130/86 | HR 84 | Ht 66.0 in | Wt 178.0 lb

## 2019-09-08 DIAGNOSIS — R269 Unspecified abnormalities of gait and mobility: Secondary | ICD-10-CM | POA: Diagnosis not present

## 2019-09-08 DIAGNOSIS — R413 Other amnesia: Secondary | ICD-10-CM | POA: Diagnosis not present

## 2019-09-08 DIAGNOSIS — G250 Essential tremor: Secondary | ICD-10-CM | POA: Diagnosis not present

## 2019-09-08 MED ORDER — DONEPEZIL HCL 10 MG PO TABS: 10.0000 mg | ORAL_TABLET | Freq: Every day | ORAL | 3 refills | Status: AC

## 2019-09-08 NOTE — Patient Instructions (Signed)
I will refer you for sleep study  Continue Aricept  See you back in 5 months

## 2019-09-08 NOTE — Progress Notes (Signed)
I have read the note, and I agree with the clinical assessment and plan.  Rayansh Herbst K Zelma Snead   

## 2019-09-08 NOTE — Progress Notes (Signed)
PATIENT: Mariah Stevenson DOB: [birth year]  REASON FOR VISIT: follow up HISTORY FROM: patient  HISTORY OF PRESENT ILLNESS: Today 09/08/19  Ms. [age] year old female with history of depression and memory trouble.  Neuropsychological evaluation in February 2019 suggested some cognitive functioning problems along with underlying depression.  She is on clonazepam for her tremor, cut back the dose to 1 mg twice daily.  MRI of the brain in 2019 was unchanged from 2016 showing chronic small vessel ischemic disease.  EEG was normal.  She has chronic tremor in her hands.  Her memory, continues to have problems forgetting what she is looking for, losing things.  She ran out of Aricept for 1 month, during that time, realized, she was actually benefiting from it.  She has good and bad days with her depression.  No longer seeing psychiatry.  No recent falls.  Continues to have chronic issues with balance.  She does not sleep well, indicates of lately she has been especially fatigued, excessive daytime drowsiness.  She indicates she does snore.  In the morning, she has significant dry mouth and sore throat.  ESS was 13.  Some days, she does not feel well at all with her depression and fatigue.  Today is not an especially great day.  She did feel well enough to drive herself here.  She lives with her daughter, but she has health issues of her own.  She presents today for evaluation unaccompanied.  HISTORY 01/25/2019 SS: Mariah Stevenson is a 74 year old female with history of depression and memory trouble.  She had a neuropsychological evaluation in February 2019 that suggested some cognitive functioning problems, along with underlying depression.  She is taking clonazepam for her tremor. She had MRI of the brain in 2019, that was unchanged from 2016 showing chronic small vessel ischemic disease. EEG was normal in 2019.  She lives with her daughter.  She continues to report trouble with her memory.  She is  able to perform all of her own ADLs.  She does drive a car.  She continues to report trouble with balance and gait instability.  She has a chronic tremor in her hands, the left is worse than the right.  For the last several months, she has not been exercising like she should.  In general she feels weaker, and does not feel like doing much.  She says her depression is under good control.  She is no longer seeing a psychiatrist.  Since last seen, she has had 2 falls, losing her balance, and feeling like her legs are giving out.  She presents today for follow-up accompanied by her daughter.   REVIEW OF SYSTEMS: Out of a complete 14 system review of symptoms, the patient complains only of the following symptoms, and all other reviewed systems are negative.  Memory loss, fatigue, snoring  ALLERGIES: Allergies  Allergen Reactions  . Sulfur Other (See Comments)    seizures  . Penicillins Rash    thrush in throat  . Gabapentin     Could not remember  . Lithium     tremors  . Prednisone     psychosis  . Primidone     Hallucinations    HOME MEDICATIONS: Outpatient Medications Prior to Visit  Medication Sig Dispense Refill  . aspirin 81 MG tablet Take 81 mg by mouth daily.    Marland Kitchen atorvastatin (LIPITOR) 20 MG tablet Take 1 tablet by mouth daily.  1  . BIOTIN PO Take 1 capsule by mouth  daily.    . buPROPion (WELLBUTRIN XL) 150 MG 24 hr tablet Take 3 tablets po daily    . Cholecalciferol (VITAMIN D3 PO) Take 2,000 Units by mouth daily.    . clonazePAM (KLONOPIN) 2 MG tablet Take 1 tablet (2 mg total) by mouth 2 (two) times daily. 180 tablet 1  . clopidogrel (PLAVIX) 75 MG tablet Take 75 mg by mouth daily.    Marland Kitchen donepezil (ARICEPT) 10 MG tablet Take 1 tablet (10 mg total) by mouth at bedtime. 90 tablet 0  . famotidine (PEPCID) 20 MG tablet One at bedtime    . FLUoxetine (PROZAC) 20 MG capsule Take 60 mg by mouth daily.   4  . fluticasone (FLONASE) 50 MCG/ACT nasal spray Place 2 sprays into both  nostrils daily.  0  . Multiple Vitamin (MULTIVITAMIN) capsule Take 1 capsule by mouth daily.    . pantoprazole (PROTONIX) 40 MG tablet Take by mouth.    . montelukast (SINGULAIR) 10 MG tablet TAKE ONE TABLET BY MOUTH AT BEDTIME 30 tablet 0  . omeprazole (PRILOSEC) 40 MG capsule Take 40 mg by mouth daily.     No facility-administered medications prior to visit.    PAST MEDICAL HISTORY: Past Medical History:  Diagnosis Date  . Altered awareness, transient   . Anxiety   . Depression   . Headache(784.0)   . Memory deficits 12/10/2012  . Seasonal allergies   . Sinus complaint     PAST SURGICAL HISTORY: Past Surgical History:  Procedure Laterality Date  . TUBAL LIGATION      FAMILY HISTORY: Family History  Problem Relation Age of Onset  . Stroke Father   . Emphysema Mother        smoked  . Asthma Grandchild   . Rectal cancer Son   . Liver cancer Son     SOCIAL HISTORY: Social History   Socioeconomic History  . Marital status: Married    Spouse name: Not on file  . Number of children: 2  . Years of education: 25  . Highest education level: Not on file  Occupational History  . Occupation: Retired    Fish farm manager: [employer]  Tobacco Use  . Smoking status: Former Smoker    Packs/day: 1.00    Years: 30.00    Pack years: 30.00    Types: Cigarettes    Quit date: 04/01/1993    Years since quitting: 26.4  . Smokeless tobacco: Never Used  Substance and Sexual Activity  . Alcohol use: Yes    Alcohol/week: 0.0 standard drinks    Comment: occas. wine  . Drug use: No  . Sexual activity: Not on file  Other Topics Concern  . Not on file  Social History Narrative   Lives with daughter   Caffeine use: 2-3 cups daily   Right handed    Social Determinants of Health   Financial Resource Strain:   . Difficulty of Paying Living Expenses:   Food Insecurity:   . Worried About Charity fundraiser in the Last Year:   . Arboriculturist in the Last Year:   Transportation Needs:   .  Film/video editor (Medical):   Marland Kitchen Lack of Transportation (Non-Medical):   Physical Activity:   . Days of Exercise per Week:   . Minutes of Exercise per Session:   Stress:   . Feeling of Stress :   Social Connections:   . Frequency of Communication with Friends and Family:   . Frequency of Social  Gatherings with Friends and Family:   . Attends Religious Services:   . Active Member of Clubs or Organizations:   . Attends Banker Meetings:   Marland Kitchen Marital Status:   Intimate Partner Violence:   . Fear of Current or Ex-Partner:   . Emotionally Abused:   Marland Kitchen Physically Abused:   . Sexually Abused:    PHYSICAL EXAM  Vitals:   09/08/19 1246  BP: 130/86  Pulse: 84  Weight: 178 lb (80.7 kg)  Height: 5\' 6"  (1.676 m)   Body mass index is 28.73 kg/m.  Generalized: Well developed, in no acute distress, tired looking MMSE - Mini Mental State Exam 09/08/2019 01/25/2019 03/16/2018  Orientation to time 3 5 5   Orientation to Place 3 5 5   Registration 3 3 3   Attention/ Calculation 1 5 5   Recall 2 2 2   Language- name 2 objects 2 2 2   Language- repeat 1 1 1   Language- follow 3 step command 2 3 3   Language- read & follow direction 1 1 1   Write a sentence 1 1 1   Copy design 1 0 0  Copy design-comments named pet names - -  Total score 20 28 28     Neurological examination  Mentation: Alert oriented to time, place, history taking. Follows all commands speech and language fluent but slow paced speech Cranial nerve II-XII: Pupils were equal round reactive to light. Extraocular movements were full, visual field were full on confrontational test. Facial sensation and strength were normal. Head turning and shoulder shrug were normal and symmetric. Jaw and neck tremor noted. Motor: The motor testing reveals 4 over 5 strength of all 4 extremities. Good symmetric motor tone is noted throughout.  Sensory: Sensory testing is intact to soft touch on all 4 extremities. No evidence of extinction  is noted.  Coordination: Cerebellar testing reveals good finger-nose-finger and heel-to-shin bilaterally. Moderate intention tremor noted bilaterally. Gait and station: Gait is normal. Tandem gait is unsteady.  Romberg is unsteady. Reflexes: Deep tendon reflexes are symmetric and normal bilaterally.   DIAGNOSTIC DATA (LABS, IMAGING, TESTING) - I reviewed patient records, labs, notes, testing and imaging myself where available.  Lab Results  Component Value Date   WBC 12.0 (H) 07/15/2016   HGB 11.7 (L) 07/15/2016   HCT 35.9 (L) 07/15/2016   MCV 87.2 07/15/2016   PLT 295.0 07/15/2016      Component Value Date/Time   NA 135 10/05/2015 1423   K 4.0 10/05/2015 1423   CL 99 10/05/2015 1423   CO2 27 10/05/2015 1423   GLUCOSE 100 (H) 10/05/2015 1423   BUN 10 10/05/2015 1423   CREATININE 0.70 10/05/2015 1423   CALCIUM 9.5 10/05/2015 1423   No results found for: CHOL, HDL, LDLCALC, LDLDIRECT, TRIG, CHOLHDL No results found for: 07/17/2016 Lab Results  Component Value Date   VITAMINB12 >2000 (H) 10/23/2017   Lab Results  Component Value Date   TSH 2.910 10/23/2017      ASSESSMENT AND PLAN 74 y.o. year old female  has a past medical history of Altered awareness, transient, Anxiety, Depression, Headache(784.0), Memory deficits (12/10/2012), Seasonal allergies, and Sinus complaint. here with:  1.  Memory loss 2.  History of depression 3.  Gait disorder 4.  Essential tremor  Today is not an especially great day.  She has shown some decline in her MMSE, 20/30 today.  She will remain on Aricept 10 mg daily.  I am going to refer her for sleep evaluation, ESS was 13, subjective  report of excessive daytime fatigue, drowsiness, and report of snoring.  Her memory troubles and depression may be further exacerbated by possible underlying OSA.  She remains on clonazepam, has been able to cut the dose back to 1 mg twice a day (PCP has been filling).  We may consider another course of gait training  physical therapy in the future.  She will follow-up in 5 months or sooner if needed.  I spent 30 minutes of face-to-face and non-face-to-face time with patient.  This included previsit chart review, lab review, study review, order entry, electronic health record documentation, patient education.  Margie Ege, AGNP-C, DNP 09/08/2019, 12:58 PM [facility] [address], [address] [place], [place] [address] [phone]

## 2019-09-29 ENCOUNTER — Institutional Professional Consult (permissible substitution): Payer: Medicare HMO | Admitting: Neurology

## 2019-10-13 ENCOUNTER — Institutional Professional Consult (permissible substitution): Payer: Medicare HMO | Admitting: Neurology

## 2019-10-27 ENCOUNTER — Institutional Professional Consult (permissible substitution): Payer: Medicare HMO | Admitting: Neurology

## 2019-10-27 ENCOUNTER — Encounter: Payer: Self-pay | Admitting: Diagnostic Neuroimaging

## 2019-10-27 ENCOUNTER — Encounter: Payer: Self-pay | Admitting: Neurology

## 2020-02-08 ENCOUNTER — Ambulatory Visit: Payer: Medicare HMO | Admitting: Neurology

## 2020-02-08 NOTE — Progress Notes (Deleted)
PATIENT: Mariah Stevenson DOB: June 17, 1945  REASON FOR VISIT: follow up HISTORY FROM: patient  HISTORY OF PRESENT ILLNESS: Today 02/08/20 Mariah Stevenson is a 74 year old female history of depression and memory troubles.  Neuropsychological evaluation has suggested some cognitive functioning problems, along with underlying depression.  Is on clonazepam for tremor.  Is on Aricept for memory.  MRI of the brain in 2019 was unchanged from 2016 showing chronic small vessel ischemic disease.  Has issues with balance.  Was referred for sleep evaluation. HISTORY 09/08/2019 SS: Mariah Stevenson 74 year old female with history of depression and memory trouble.  Neuropsychological evaluation in February 2019 suggested some cognitive functioning problems along with underlying depression.  She is on clonazepam for her tremor, cut back the dose to 1 mg twice daily.  MRI of the brain in 2019 was unchanged from 2016 showing chronic small vessel ischemic disease.  EEG was normal.  She has chronic tremor in her hands.  Her memory, continues to have problems forgetting what she is looking for, losing things.  She ran out of Aricept for 1 month, during that time, realized, she was actually benefiting from it.  She has good and bad days with her depression.  No longer seeing psychiatry.  No recent falls.  Continues to have chronic issues with balance.  She does not sleep well, indicates of lately she has been especially fatigued, excessive daytime drowsiness.  She indicates she does snore.  In the morning, she has significant dry mouth and sore throat.  ESS was 13.  Some days, she does not feel well at all with her depression and fatigue.  Today is not an especially great day.  She did feel well enough to drive herself here.  She lives with her daughter, but she has health issues of her own.  She presents today for evaluation unaccompanied.   REVIEW OF SYSTEMS: Out of a complete 14 system review of symptoms, the patient  complains only of the following symptoms, and all other reviewed systems are negative.  ALLERGIES: Allergies  Allergen Reactions  . Sulfur Other (See Comments)    seizures  . Penicillins Rash    thrush in throat  . Gabapentin     Could not remember  . Lithium     tremors  . Prednisone     psychosis  . Primidone     Hallucinations    HOME MEDICATIONS: Outpatient Medications Prior to Visit  Medication Sig Dispense Refill  . aspirin 81 MG tablet Take 81 mg by mouth daily.    Marland Kitchen atorvastatin (LIPITOR) 20 MG tablet Take 1 tablet by mouth daily.  1  . BIOTIN PO Take 1 capsule by mouth daily.    Marland Kitchen buPROPion (WELLBUTRIN XL) 150 MG 24 hr tablet Take 3 tablets po daily    . Cholecalciferol (VITAMIN D3 PO) Take 2,000 Units by mouth daily.    . clonazePAM (KLONOPIN) 2 MG tablet Take 1 tablet (2 mg total) by mouth 2 (two) times daily. 180 tablet 1  . clopidogrel (PLAVIX) 75 MG tablet Take 75 mg by mouth daily.    Marland Kitchen donepezil (ARICEPT) 10 MG tablet Take 1 tablet (10 mg total) by mouth at bedtime. 90 tablet 3  . famotidine (PEPCID) 20 MG tablet One at bedtime    . FLUoxetine (PROZAC) 20 MG capsule Take 60 mg by mouth daily.   4  . fluticasone (FLONASE) 50 MCG/ACT nasal spray Place 2 sprays into both nostrils daily.  0  . Multiple  Vitamin (MULTIVITAMIN) capsule Take 1 capsule by mouth daily.    . pantoprazole (PROTONIX) 40 MG tablet Take by mouth.     No facility-administered medications prior to visit.    PAST MEDICAL HISTORY: Past Medical History:  Diagnosis Date  . Altered awareness, transient   . Anxiety   . Depression   . Headache(784.0)   . Memory deficits 12/10/2012  . Seasonal allergies   . Sinus complaint     PAST SURGICAL HISTORY: Past Surgical History:  Procedure Laterality Date  . TUBAL LIGATION      FAMILY HISTORY: Family History  Problem Relation Age of Onset  . Stroke Father   . Emphysema Mother        smoked  . Asthma Grandchild   . Rectal cancer Son   .  Liver cancer Son     SOCIAL HISTORY: Social History   Socioeconomic History  . Marital status: Married    Spouse name: Not on file  . Number of children: 2  . Years of education: 59  . Highest education level: Not on file  Occupational History  . Occupation: Retired    Associate Professor: [employer]  Tobacco Use  . Smoking status: Former Smoker    Packs/day: 1.00    Years: 30.00    Pack years: 30.00    Types: Cigarettes    Quit date: 04/01/1993    Years since quitting: 26.8  . Smokeless tobacco: Never Used  Vaping Use  . Vaping Use: Never used  Substance and Sexual Activity  . Alcohol use: Yes    Alcohol/week: 0.0 standard drinks    Comment: occas. wine  . Drug use: No  . Sexual activity: Not on file  Other Topics Concern  . Not on file  Social History Narrative   Lives with daughter   Caffeine use: 2-3 cups daily   Right handed    Social Determinants of Health   Financial Resource Strain:   . Difficulty of Paying Living Expenses: Not on file  Food Insecurity:   . Worried About Programme researcher, broadcasting/film/video in the Last Year: Not on file  . Ran Out of Food in the Last Year: Not on file  Transportation Needs:   . Lack of Transportation (Medical): Not on file  . Lack of Transportation (Non-Medical): Not on file  Physical Activity:   . Days of Exercise per Week: Not on file  . Minutes of Exercise per Session: Not on file  Stress:   . Feeling of Stress : Not on file  Social Connections:   . Frequency of Communication with Friends and Family: Not on file  . Frequency of Social Gatherings with Friends and Family: Not on file  . Attends Religious Services: Not on file  . Active Member of Clubs or Organizations: Not on file  . Attends Banker Meetings: Not on file  . Marital Status: Not on file  Intimate Partner Violence:   . Fear of Current or Ex-Partner: Not on file  . Emotionally Abused: Not on file  . Physically Abused: Not on file  . Sexually Abused: Not on file       PHYSICAL EXAM  There were no vitals filed for this visit. There is no height or weight on file to calculate BMI.  Generalized: Well developed, in no acute distress   Neurological examination  Mentation: Alert oriented to time, place, history taking. Follows all commands speech and language fluent Cranial nerve II-XII: Pupils were equal round reactive to  light. Extraocular movements were full, visual field were full on confrontational test. Facial sensation and strength were normal. Uvula tongue midline. Head turning and shoulder shrug  were normal and symmetric. Motor: The motor testing reveals 5 over 5 strength of all 4 extremities. Good symmetric motor tone is noted throughout.  Sensory: Sensory testing is intact to soft touch on all 4 extremities. No evidence of extinction is noted.  Coordination: Cerebellar testing reveals good finger-nose-finger and heel-to-shin bilaterally.  Gait and station: Gait is normal. Tandem gait is normal. Romberg is negative. No drift is seen.  Reflexes: Deep tendon reflexes are symmetric and normal bilaterally.   DIAGNOSTIC DATA (LABS, IMAGING, TESTING) - I reviewed patient records, labs, notes, testing and imaging myself where available.  Lab Results  Component Value Date   WBC 12.0 (H) 07/15/2016   HGB 11.7 (L) 07/15/2016   HCT 35.9 (L) 07/15/2016   MCV 87.2 07/15/2016   PLT 295.0 07/15/2016      Component Value Date/Time   NA 135 10/05/2015 1423   K 4.0 10/05/2015 1423   CL 99 10/05/2015 1423   CO2 27 10/05/2015 1423   GLUCOSE 100 (H) 10/05/2015 1423   BUN 10 10/05/2015 1423   CREATININE 0.70 10/05/2015 1423   CALCIUM 9.5 10/05/2015 1423   No results found for: CHOL, HDL, LDLCALC, LDLDIRECT, TRIG, CHOLHDL No results found for: [id number] Lab Results  Component Value Date   VITAMINB12 >2000 (H) 10/23/2017   Lab Results  Component Value Date   TSH 2.910 10/23/2017      ASSESSMENT AND PLAN 74 y.o. year old female  has a past  medical history of Altered awareness, transient, Anxiety, Depression, Headache(784.0), Memory deficits (12/10/2012), Seasonal allergies, and Sinus complaint. here with ***   I spent 15 minutes with the patient. 50% of this time was spent   Margie Ege, [place], DNP 02/08/2020, 5:39 AM [facility] [address] [place], [place] [address] [phone]

## 2020-02-14 ENCOUNTER — Ambulatory Visit (INDEPENDENT_AMBULATORY_CARE_PROVIDER_SITE_OTHER): Payer: Medicare Other | Admitting: Neurology

## 2020-02-14 ENCOUNTER — Encounter: Payer: Self-pay | Admitting: Neurology

## 2020-02-14 VITALS — BP 118/70 | HR 62 | Ht 66.0 in | Wt 180.6 lb

## 2020-02-14 DIAGNOSIS — R413 Other amnesia: Secondary | ICD-10-CM

## 2020-02-14 DIAGNOSIS — R269 Unspecified abnormalities of gait and mobility: Secondary | ICD-10-CM

## 2020-02-14 DIAGNOSIS — G250 Essential tremor: Secondary | ICD-10-CM

## 2020-02-14 NOTE — Progress Notes (Signed)
PATIENT: Mariah Stevenson DOB: 29-Jan-1946  REASON FOR VISIT: follow up HISTORY FROM: patient  HISTORY OF PRESENT ILLNESS: Today 02/14/20 Mariah Stevenson is a 74 year old female with history of depression and memory trouble.  Neuropsychological evaluation in February 2019 suggested some cognitive functioning problems along with underlying depression.  She is on clonazepam for tremor and anxiety (cut back to 1/2  Tablet twice daily).  MRI of the brain in 2019 was stable from 2016 showing chronic small vessel ischemic disease.  EEG was normal.  She has tremor in both hands, affects handwriting.  Has mild memory issues, may lose things, trouble subtracting #'s in her mind.  Has chronic depression, has not had psychiatrist in several years, her dose of Wellbutrin has been decreased by PCP.  At last visit, was referred for sleep evaluation, but she did not go.  Several months ago, she had a fall, bruised her low back, completed PT, with excellent benefit.  Has not continued her exercises at home.  Lives with her daughter, she can't help her with exercises.  She drives a car.  Does her own ADLs.  Today, is not a great day, is in a fog from depression.   She snores at night, has daytime fatigue, drowsiness, morning headache.  Has noted vivid dreams at night with Aricept, quite significantly.  PCP has checked a B12, TSH, are within normal range, is on vitamin D supplement.  Presents today for evaluation unaccompanied.  HISTORY 09/08/2019 SS: Mariah Stevenson 74 year old female with history of depression and memory trouble.  Neuropsychological evaluation in February 2019 suggested some cognitive functioning problems along with underlying depression.  She is on clonazepam for her tremor, cut back the dose to 1 mg twice daily.  MRI of the brain in 2019 was unchanged from 2016 showing chronic small vessel ischemic disease.  EEG was normal.  She has chronic tremor in her hands.  Her memory, continues to have  problems forgetting what she is looking for, losing things.  She ran out of Aricept for 1 month, during that time, realized, she was actually benefiting from it.  She has good and bad days with her depression.  No longer seeing psychiatry.  No recent falls.  Continues to have chronic issues with balance.  She does not sleep well, indicates of lately she has been especially fatigued, excessive daytime drowsiness.  She indicates she does snore.  In the morning, she has significant dry mouth and sore throat.  ESS was 13.  Some days, she does not feel well at all with her depression and fatigue.  Today is not an especially great day.  She did feel well enough to drive herself here.  She lives with her daughter, but she has health issues of her own.  She presents today for evaluation unaccompanied.   REVIEW OF SYSTEMS: Out of a complete 14 system review of symptoms, the patient complains only of the following symptoms, and all other reviewed systems are negative.  Depression, tremor, snoring, memory loss  ALLERGIES: Allergies  Allergen Reactions   Sulfur Other (See Comments)    seizures   Penicillins Rash    thrush in throat   Gabapentin     Could not remember   Lithium     tremors   Prednisone     psychosis   Primidone     Hallucinations    HOME MEDICATIONS: Outpatient Medications Prior to Visit  Medication Sig Dispense Refill   aspirin 81 MG tablet Take 81  mg by mouth daily.     atorvastatin (LIPITOR) 20 MG tablet Take 1 tablet by mouth daily.  1   BIOTIN PO Take 1 capsule by mouth daily.     buPROPion (WELLBUTRIN XL) 150 MG 24 hr tablet Take 3 tablets po daily     Cholecalciferol (VITAMIN D3 PO) Take 2,000 Units by mouth daily.     clonazePAM (KLONOPIN) 2 MG tablet Take 1 tablet (2 mg total) by mouth 2 (two) times daily. 180 tablet 1   clopidogrel (PLAVIX) 75 MG tablet Take 75 mg by mouth daily.     donepezil (ARICEPT) 10 MG tablet Take 1 tablet (10 mg total) by mouth at  bedtime. 90 tablet 3   famotidine (PEPCID) 20 MG tablet One at bedtime     FLUoxetine (PROZAC) 20 MG capsule Take 60 mg by mouth daily.   4   fluticasone (FLONASE) 50 MCG/ACT nasal spray Place 2 sprays into both nostrils daily.  0   Multiple Vitamin (MULTIVITAMIN) capsule Take 1 capsule by mouth daily.     pantoprazole (PROTONIX) 40 MG tablet Take by mouth.     No facility-administered medications prior to visit.    PAST MEDICAL HISTORY: Past Medical History:  Diagnosis Date   Altered awareness, transient    Anxiety    Depression    Headache(784.0)    Memory deficits 12/10/2012   Seasonal allergies    Sinus complaint     PAST SURGICAL HISTORY: Past Surgical History:  Procedure Laterality Date   TUBAL LIGATION      FAMILY HISTORY: Family History  Problem Relation Age of Onset   Stroke Father    Emphysema Mother        smoked   Asthma Grandchild    Rectal cancer Son    Liver cancer Son     SOCIAL HISTORY: Social History   Socioeconomic History   Marital status: Married    Spouse name: Not on file   Number of children: 2   Years of education: 13   Highest education level: Not on file  Occupational History   Occupation: Retired    Associate Professor: [employer]  Tobacco Use   Smoking status: Former Smoker    Packs/day: 1.00    Years: 30.00    Pack years: 30.00    Types: Cigarettes    Quit date: 04/01/1993    Years since quitting: 26.8   Smokeless tobacco: Never Used  Vaping Use   Vaping Use: Never used  Substance and Sexual Activity   Alcohol use: Yes    Alcohol/week: 0.0 standard drinks    Comment: occas. wine   Drug use: No   Sexual activity: Not on file  Other Topics Concern   Not on file  Social History Narrative   Lives with daughter   Caffeine use: 2-3 cups daily   Right handed    Social Determinants of Health   Financial Resource Strain:    Difficulty of Paying Living Expenses: Not on file  Food Insecurity:    Worried  About Running Out of Food in the Last Year: Not on file   Ran Out of Food in the Last Year: Not on file  Transportation Needs:    Lack of Transportation (Medical): Not on file   Lack of Transportation (Non-Medical): Not on file  Physical Activity:    Days of Exercise per Week: Not on file   Minutes of Exercise per Session: Not on file  Stress:    Feeling of Stress :  Not on file  Social Connections:    Frequency of Communication with Friends and Family: Not on file   Frequency of Social Gatherings with Friends and Family: Not on file   Attends Religious Services: Not on file   Active Member of Clubs or Organizations: Not on file   Attends Banker Meetings: Not on file   Marital Status: Not on file  Intimate Partner Violence:    Fear of Current or Ex-Partner: Not on file   Emotionally Abused: Not on file   Physically Abused: Not on file   Sexually Abused: Not on file    PHYSICAL EXAM  Vitals:   02/14/20 1417  BP: 118/70  Pulse: 62  Weight: 180 lb 9.6 oz (81.9 kg)  Height: 5\' 6"  (1.676 m)   Body mass index is 29.15 kg/m.  Generalized: Well developed, in no acute distress  MMSE - Mini Mental State Exam 02/14/2020 09/08/2019 01/25/2019  Orientation to time 5 3 5   Orientation to Place 5 3 5   Registration 3 3 3   Attention/ Calculation 4 1 5   Recall 2 2 2   Language- name 2 objects 2 2 2   Language- repeat 0 1 1  Language- follow 3 step command 3 2 3   Language- read & follow direction 1 1 1   Write a sentence 1 1 1   Copy design 1 1 0  Copy design-comments - named pet names -  Total score 27 20 28     Neurological examination  Mentation: Alert oriented to time, place, history taking. Follows all commands speech and language fluent, rather flat, depressed looking Cranial nerve II-XII: Pupils were equal round reactive to light. Extraocular movements were full, visual field were full on confrontational test. Facial sensation and strength were normal.  Head turning and shoulder shrug were normal and symmetric. Motor: The motor testing reveals 5 over 5 strength of all 4 extremities. Good symmetric motor tone is noted throughout.  Sensory: Sensory testing is intact to soft touch on all 4 extremities. No evidence of extinction is noted.  Coordination: Cerebellar testing reveals good finger-nose-finger and heel-to-shin bilaterally.  Mild tremor with finger-nose-finger bilaterally. Gait and station: Gait is slow, but steady.  Reflexes: Deep tendon reflexes are symmetric and normal bilaterally.   DIAGNOSTIC DATA (LABS, IMAGING, TESTING) - I reviewed patient records, labs, notes, testing and imaging myself where available.  Lab Results  Component Value Date   WBC 12.0 (H) 07/15/2016   HGB 11.7 (L) 07/15/2016   HCT 35.9 (L) 07/15/2016   MCV 87.2 07/15/2016   PLT 295.0 07/15/2016      Component Value Date/Time   NA 135 10/05/2015 1423   K 4.0 10/05/2015 1423   CL 99 10/05/2015 1423   CO2 27 10/05/2015 1423   GLUCOSE 100 (H) 10/05/2015 1423   BUN 10 10/05/2015 1423   CREATININE 0.70 10/05/2015 1423   CALCIUM 9.5 10/05/2015 1423   No results found for: CHOL, HDL, LDLCALC, LDLDIRECT, TRIG, CHOLHDL No results found for: 07/17/2016 Lab Results  Component Value Date   VITAMINB12 >2000 (H) 10/23/2017   Lab Results  Component Value Date   TSH 2.910 10/23/2017    ASSESSMENT AND PLAN 74 y.o. year old female  has a past medical history of Altered awareness, transient, Anxiety, Depression, Headache(784.0), Memory deficits (12/10/2012), Seasonal allergies, and Sinus complaint. here with:  1.  Memory loss 2.  Depression 3.  Gait disorder 4.  Essential tremor -MMSE 27/30 today -Switch Aricept to take in the morning, due  to vivid dreams -Refer for sleep evaluation r/o OSA (daytime drowsiness, fatigue, snoring, underlying depression, memory issues, ESS was 10) -PCP is prescribing clonazepam -Follow-up in 6 months or sooner if needed  I spent  30 minutes of face-to-face and non-face-to-face time with patient.  This included previsit chart review, lab review, study review, order entry, electronic health record documentation, patient education.  Margie EgeSarah Nishawn Rotan, AGNP-C, DNP 02/14/2020, 2:50 PM [facility] [address], [address] [place], [place] [address] [phone]

## 2020-02-14 NOTE — Patient Instructions (Addendum)
Switch Aricept to take in the morning to see if less effect on dreaming I will send you for sleep evaluation  See you back in 6 months

## 2020-02-14 NOTE — Progress Notes (Signed)
I have read the note, and I agree with the clinical assessment and plan.  Antionne Enrique K Hillery Zachman   

## 2020-02-17 ENCOUNTER — Ambulatory Visit: Payer: Medicare Other | Admitting: Neurology

## 2020-03-16 ENCOUNTER — Telehealth: Payer: Self-pay | Admitting: Neurology

## 2020-03-16 ENCOUNTER — Institutional Professional Consult (permissible substitution): Payer: Medicare Other | Admitting: Neurology

## 2020-03-16 ENCOUNTER — Telehealth: Payer: Self-pay | Admitting: *Deleted

## 2020-03-16 NOTE — Telephone Encounter (Signed)
Patient was no show today, has had > 2 no shows.

## 2020-03-16 NOTE — Telephone Encounter (Signed)
Patient had at least 2 no-shows for new sleep evaluation.  Please do not reschedule for sleep evaluation with me.

## 2020-03-16 NOTE — Telephone Encounter (Deleted)
Pt no showed to sleep consult on 10/27/19 with Dr. Frances Furbish. When I rescheduled the pt I informed her if she couldn't make the new appt to please call 24 hours in advance to cancel if she did not show to new appt I would not be able to reschedule her again. Pt verbalized understanding. Pt no showed to another sleep consult on 03/16/20 with Dr. Frances Furbish. Per our policy patient has no showed to 2 new patient/slp consult appointment. This would result in dismissing the pt. Per Angie if we dismiss the patient, then she is dismissed from the entire practice. Do you wish to not reschedule the pt here for sleep that way the pt can still see Sarah/Dr. Anne Hahn or dismiss the pt all together?

## 2020-03-21 ENCOUNTER — Encounter: Payer: Self-pay | Admitting: Neurology

## 2020-07-13 ENCOUNTER — Other Ambulatory Visit: Payer: Self-pay | Admitting: Neurology

## 2020-08-14 ENCOUNTER — Ambulatory Visit: Payer: Medicare Other | Admitting: Neurology

## 2022-05-17 ENCOUNTER — Observation Stay (HOSPITAL_COMMUNITY)
Admission: EM | Admit: 2022-05-17 | Discharge: 2022-05-20 | Disposition: A | Discharge status: Skilled Nursing Facility | Payer: Medicare PPO | Attending: Internal Medicine | Admitting: Internal Medicine

## 2022-05-17 ENCOUNTER — Other Ambulatory Visit: Payer: Self-pay

## 2022-05-17 ENCOUNTER — Encounter (HOSPITAL_COMMUNITY): Payer: Self-pay | Admitting: Emergency Medicine

## 2022-05-17 ENCOUNTER — Emergency Department (HOSPITAL_COMMUNITY): Payer: Medicare PPO

## 2022-05-17 DIAGNOSIS — R413 Other amnesia: Secondary | ICD-10-CM | POA: Diagnosis present

## 2022-05-17 DIAGNOSIS — W19XXXA Unspecified fall, initial encounter: Secondary | ICD-10-CM | POA: Diagnosis not present

## 2022-05-17 DIAGNOSIS — Z7982 Long term (current) use of aspirin: Secondary | ICD-10-CM | POA: Diagnosis not present

## 2022-05-17 DIAGNOSIS — Z79899 Other long term (current) drug therapy: Secondary | ICD-10-CM | POA: Diagnosis not present

## 2022-05-17 DIAGNOSIS — Z7902 Long term (current) use of antithrombotics/antiplatelets: Secondary | ICD-10-CM | POA: Insufficient documentation

## 2022-05-17 DIAGNOSIS — Y93K1 Activity, walking an animal: Secondary | ICD-10-CM | POA: Insufficient documentation

## 2022-05-17 DIAGNOSIS — M6281 Muscle weakness (generalized): Secondary | ICD-10-CM | POA: Insufficient documentation

## 2022-05-17 DIAGNOSIS — Z87891 Personal history of nicotine dependence: Secondary | ICD-10-CM | POA: Diagnosis not present

## 2022-05-17 DIAGNOSIS — R2681 Unsteadiness on feet: Secondary | ICD-10-CM | POA: Diagnosis not present

## 2022-05-17 DIAGNOSIS — N390 Urinary tract infection, site not specified: Secondary | ICD-10-CM | POA: Insufficient documentation

## 2022-05-17 DIAGNOSIS — S32592A Other specified fracture of left pubis, initial encounter for closed fracture: Secondary | ICD-10-CM | POA: Diagnosis not present

## 2022-05-17 DIAGNOSIS — S52592A Other fractures of lower end of left radius, initial encounter for closed fracture: Secondary | ICD-10-CM | POA: Insufficient documentation

## 2022-05-17 DIAGNOSIS — N3001 Acute cystitis with hematuria: Secondary | ICD-10-CM

## 2022-05-17 DIAGNOSIS — R269 Unspecified abnormalities of gait and mobility: Secondary | ICD-10-CM

## 2022-05-17 DIAGNOSIS — R2689 Other abnormalities of gait and mobility: Secondary | ICD-10-CM | POA: Diagnosis not present

## 2022-05-17 DIAGNOSIS — S32591A Other specified fracture of right pubis, initial encounter for closed fracture: Principal | ICD-10-CM | POA: Diagnosis present

## 2022-05-17 DIAGNOSIS — Y92099 Unspecified place in other non-institutional residence as the place of occurrence of the external cause: Secondary | ICD-10-CM | POA: Insufficient documentation

## 2022-05-17 DIAGNOSIS — G250 Essential tremor: Secondary | ICD-10-CM | POA: Diagnosis present

## 2022-05-17 LAB — CBC WITH DIFFERENTIAL/PLATELET
Abs Immature Granulocytes: 0.04 10*3/uL (ref 0.00–0.07)
Basophils Absolute: 0 10*3/uL (ref 0.0–0.1)
Basophils Relative: 0 %
Eosinophils Absolute: 0.5 10*3/uL (ref 0.0–0.5)
Eosinophils Relative: 5 %
HCT: 39.5 % (ref 36.0–46.0)
Hemoglobin: 13.2 g/dL (ref 12.0–15.0)
Immature Granulocytes: 0 %
Lymphocytes Relative: 15 %
Lymphs Abs: 1.5 10*3/uL (ref 0.7–4.0)
MCH: 31.4 pg (ref 26.0–34.0)
MCHC: 33.4 g/dL (ref 30.0–36.0)
MCV: 94 fL (ref 80.0–100.0)
Monocytes Absolute: 0.4 10*3/uL (ref 0.1–1.0)
Monocytes Relative: 4 %
Neutro Abs: 7.8 10*3/uL — ABNORMAL HIGH (ref 1.7–7.7)
Neutrophils Relative %: 76 %
Platelets: 206 10*3/uL (ref 150–400)
RBC: 4.2 MIL/uL (ref 3.87–5.11)
RDW: 12.4 % (ref 11.5–15.5)
WBC: 10.3 10*3/uL (ref 4.0–10.5)
nRBC: 0 % (ref 0.0–0.2)

## 2022-05-17 LAB — COMPREHENSIVE METABOLIC PANEL
ALT: 23 U/L (ref 0–44)
AST: 36 U/L (ref 15–41)
Albumin: 3.8 g/dL (ref 3.5–5.0)
Alkaline Phosphatase: 78 U/L (ref 38–126)
Anion gap: 9 (ref 5–15)
BUN: 14 mg/dL (ref 8–23)
CO2: 27 mmol/L (ref 22–32)
Calcium: 9.2 mg/dL (ref 8.9–10.3)
Chloride: 100 mmol/L (ref 98–111)
Creatinine, Ser: 0.84 mg/dL (ref 0.44–1.00)
GFR, Estimated: >60 mL/min (ref >60)
Glucose, Bld: 123 mg/dL — ABNORMAL HIGH (ref 70–99)
Potassium: 3.7 mmol/L (ref 3.5–5.1)
Sodium: 136 mmol/L (ref 135–145)
Total Bilirubin: 0.9 mg/dL (ref 0.3–1.2)
Total Protein: 6.9 g/dL (ref 6.5–8.1)

## 2022-05-17 LAB — TSH: TSH: 1.602 u[IU]/mL (ref 0.350–4.500)

## 2022-05-17 MED ORDER — MULTIVITAMINS PO CAPS: 1.0000 | ORAL_CAPSULE | Freq: Every day | ORAL | Status: DC

## 2022-05-17 MED ORDER — SODIUM CHLORIDE 0.9% FLUSH: 3.0000 mL | INTRAVENOUS | Status: DC | PRN

## 2022-05-17 MED ORDER — SODIUM CHLORIDE 0.9% FLUSH
3.0000 mL | Freq: Two times a day (BID) | INTRAVENOUS | Status: DC
  Administered 2022-05-17 – 2022-05-20 (×6): 3 mL via INTRAVENOUS

## 2022-05-17 MED ORDER — ONDANSETRON HCL 4 MG PO TABS: 4.0000 mg | ORAL_TABLET | Freq: Four times a day (QID) | ORAL | Status: DC | PRN

## 2022-05-17 MED ORDER — ATORVASTATIN CALCIUM 10 MG PO TABS
20.0000 mg | ORAL_TABLET | Freq: Every day | ORAL | Status: DC
  Administered 2022-05-18 – 2022-05-20 (×3): 20 mg via ORAL
  Filled 2022-05-17 (×3): qty 2

## 2022-05-17 MED ORDER — ACETAMINOPHEN 325 MG PO TABS
650.0000 mg | ORAL_TABLET | Freq: Four times a day (QID) | ORAL | Status: DC | PRN
  Administered 2022-05-18 – 2022-05-19 (×2): 650 mg via ORAL
  Filled 2022-05-17 (×2): qty 2

## 2022-05-17 MED ORDER — ASPIRIN 81 MG PO TBEC
81.0000 mg | DELAYED_RELEASE_TABLET | Freq: Every day | ORAL | Status: DC
  Administered 2022-05-18 – 2022-05-20 (×3): 81 mg via ORAL
  Filled 2022-05-17 (×3): qty 1

## 2022-05-17 MED ORDER — PROPRANOLOL HCL 10 MG PO TABS
10.0000 mg | ORAL_TABLET | Freq: Two times a day (BID) | ORAL | Status: DC
  Administered 2022-05-17 – 2022-05-19 (×4): 10 mg via ORAL
  Filled 2022-05-17 (×10): qty 1

## 2022-05-17 MED ORDER — HYDROMORPHONE HCL 1 MG/ML IJ SOLN
0.5000 mg | INTRAMUSCULAR | Status: DC | PRN
  Administered 2022-05-17: 1 mg via INTRAVENOUS
  Administered 2022-05-17 – 2022-05-18 (×2): 0.5 mg via INTRAVENOUS
  Filled 2022-05-17: qty 0.5
  Filled 2022-05-17: qty 1
  Filled 2022-05-17: qty 0.5

## 2022-05-17 MED ORDER — FLUTICASONE PROPIONATE 50 MCG/ACT NA SUSP: 2.0000 | Freq: Every day | NASAL | Status: DC | PRN

## 2022-05-17 MED ORDER — ADULT MULTIVITAMIN W/MINERALS CH
1.0000 | ORAL_TABLET | Freq: Every day | ORAL | Status: DC
  Administered 2022-05-18 – 2022-05-20 (×3): 1 via ORAL
  Filled 2022-05-17 (×3): qty 1

## 2022-05-17 MED ORDER — SUMATRIPTAN SUCCINATE 100 MG PO TABS
100.0000 mg | ORAL_TABLET | ORAL | Status: DC | PRN
  Administered 2022-05-18: 100 mg via ORAL
  Filled 2022-05-17 (×3): qty 1

## 2022-05-17 MED ORDER — ENOXAPARIN SODIUM 40 MG/0.4ML IJ SOSY
40.0000 mg | PREFILLED_SYRINGE | Freq: Every day | INTRAMUSCULAR | Status: DC
  Administered 2022-05-17 – 2022-05-18 (×2): 40 mg via SUBCUTANEOUS
  Filled 2022-05-17 (×2): qty 0.4

## 2022-05-17 MED ORDER — SODIUM CHLORIDE 0.9 % IV SOLN: 250.0000 mL | INTRAVENOUS | Status: DC | PRN

## 2022-05-17 MED ORDER — ONDANSETRON HCL 4 MG/2ML IJ SOLN: 4.0000 mg | Freq: Four times a day (QID) | INTRAMUSCULAR | Status: DC | PRN

## 2022-05-17 MED ORDER — ACETAMINOPHEN 650 MG RE SUPP: 650.0000 mg | Freq: Four times a day (QID) | RECTAL | Status: DC | PRN

## 2022-05-17 MED ORDER — BUPROPION HCL ER (XL) 150 MG PO TB24
450.0000 mg | ORAL_TABLET | Freq: Every day | ORAL | Status: DC
  Administered 2022-05-18 – 2022-05-20 (×3): 450 mg via ORAL
  Filled 2022-05-17 (×3): qty 3

## 2022-05-17 MED ORDER — DONEPEZIL HCL 5 MG PO TABS
10.0000 mg | ORAL_TABLET | Freq: Every day | ORAL | Status: DC
  Administered 2022-05-17 – 2022-05-19 (×3): 10 mg via ORAL
  Filled 2022-05-17 (×3): qty 2

## 2022-05-17 MED ORDER — ACETAMINOPHEN 325 MG PO TABS: 650.0000 mg | ORAL_TABLET | Freq: Once | ORAL | Status: DC

## 2022-05-17 MED ORDER — ENOXAPARIN SODIUM 40 MG/0.4ML IJ SOSY: 40.0000 mg | PREFILLED_SYRINGE | INTRAMUSCULAR | Status: DC

## 2022-05-17 MED ORDER — FLUOXETINE HCL 20 MG PO CAPS
60.0000 mg | ORAL_CAPSULE | Freq: Every day | ORAL | Status: DC
  Administered 2022-05-18 – 2022-05-20 (×3): 60 mg via ORAL
  Filled 2022-05-17 (×3): qty 3

## 2022-05-17 MED ORDER — CLOPIDOGREL BISULFATE 75 MG PO TABS
75.0000 mg | ORAL_TABLET | Freq: Every day | ORAL | Status: DC
  Administered 2022-05-18 – 2022-05-20 (×3): 75 mg via ORAL
  Filled 2022-05-17 (×3): qty 1

## 2022-05-17 MED ORDER — HYDROCODONE-ACETAMINOPHEN 5-325 MG PO TABS
1.0000 | ORAL_TABLET | Freq: Once | ORAL | Status: AC
  Administered 2022-05-17: 1 via ORAL
  Filled 2022-05-17: qty 1

## 2022-05-17 MED ORDER — OXYCODONE HCL 5 MG PO TABS
5.0000 mg | ORAL_TABLET | ORAL | Status: DC | PRN
  Administered 2022-05-19 – 2022-05-20 (×3): 5 mg via ORAL
  Filled 2022-05-17 (×3): qty 1

## 2022-05-17 MED ORDER — SENNA 8.6 MG PO TABS
1.0000 | ORAL_TABLET | Freq: Every day | ORAL | Status: DC
  Filled 2022-05-17 (×3): qty 1

## 2022-05-17 MED ORDER — CLONAZEPAM 0.5 MG PO TABS
2.0000 mg | ORAL_TABLET | Freq: Two times a day (BID) | ORAL | Status: DC
  Administered 2022-05-17 – 2022-05-20 (×5): 2 mg via ORAL
  Filled 2022-05-17 (×5): qty 4

## 2022-05-17 MED ORDER — PANTOPRAZOLE SODIUM 40 MG PO TBEC
40.0000 mg | DELAYED_RELEASE_TABLET | Freq: Every day | ORAL | Status: DC
  Administered 2022-05-18 – 2022-05-20 (×3): 40 mg via ORAL
  Filled 2022-05-17 (×3): qty 1

## 2022-05-17 NOTE — TOC Initial Note (Signed)
Transition of Care [facility]) - Initial/Assessment Note    Patient Details  Name: Mariah Stevenson MRN: [id number] Date of Birth: 08/12/1945  [facility]) CM/SW Contact:    Salome Arnt, [place] Phone Number: 05/17/2022, 1:05 PM  Clinical Narrative: Pt admitted with bilateral pubic rami fractures. Pt reports she lives with her daughter. PT evaluated pt and recommend SNF. Discussed placement process and SNF authorization. Pt requests [facility]. Will initiate bed search and authorization. LCSW attempted to reach daughter, [name] [phone]) but unable to reach.                   Expected Discharge Plan: [place] Barriers to Discharge: Continued Medical Work up   Patient Goals and CMS Choice Patient states their goals for this hospitalization and ongoing recovery are:: short term rehab   Choice offered to / list presented to : Patient [place] ownership interest in [facility].provided to:: Patient    Expected Discharge Plan and Services In-house Referral: Clinical Social Work   Post Acute Care Choice: [facility] Living arrangements for the past 2 months: [place]                                      Prior Living Arrangements/Services Living arrangements for the past 2 months: Single Family Home Lives with:: Adult Children Patient language and need for interpreter reviewed:: Yes Do you feel safe going back to the place where you live?: Yes          Current home services: DME (cane, walker) Criminal Activity/Legal Involvement Pertinent to Current Situation/Hospitalization: No - Comment as needed  Activities of Daily Living Home Assistive Devices/Equipment: Cane (specify quad or straight) ADL Screening (condition at time of admission) Patient's cognitive ability adequate to safely complete daily activities?: Yes Is the patient deaf or have difficulty hearing?:  No Does the patient have difficulty seeing, even when wearing glasses/contacts?: No Does the patient have difficulty concentrating, remembering, or making decisions?: No Patient able to express need for assistance with ADLs?: Yes Does the patient have difficulty dressing or bathing?: No Independently performs ADLs?: Yes (appropriate for developmental age) Does the patient have difficulty walking or climbing stairs?: Yes Weakness of Legs: Both Weakness of Arms/Hands: Both  Permission Sought/Granted                  Emotional Assessment     Affect (typically observed): Appropriate Orientation: : Oriented to Self, Oriented to Place, Oriented to  Time, Oriented to Situation Alcohol / Substance Use: Not Applicable Psych Involvement: No (comment)  Admission diagnosis:  Bilateral pubic rami fractures ([place]) [S32.591A, S32.592A] Closed fracture of ramus of left pubis, initial encounter ([place]) [S32.592A] Patient Active Problem List   Diagnosis Date Noted   Bilateral pubic rami fractures ([place]) 05/17/2022   Gait disorder 01/25/2019   Essential tremor 01/25/2019   Obesity (BMI 30-39.9) 07/17/2016   Cough variant asthma vs UACS  10/05/2015   Dyspnea 10/05/2015   Pulmonary infiltrates c/w mild / transient BOOP 10/05/2015   Memory deficits 12/10/2012   Headache(784.0) 08/05/2012   Altered awareness, transient 08/05/2012   PCP:  Curly Rim, MD Pharmacy:   [place], [place] - [address] [place] [address] Phone: [phone] Fax: [place], [place] - [address] [place]  [address] [place] [address] Phone: [phone] Fax: [phone]     Social Determinants of Health (SDOH) Social History: SDOH Screenings   Food Insecurity: No Food Insecurity (05/17/2022)  Housing: Low Risk  (05/17/2022)  Transportation Needs: No Transportation Needs (05/17/2022)  Utilities: Not At Risk (05/17/2022)   Tobacco Use: Medium Risk (05/17/2022)   SDOH Interventions: Housing Interventions: Intervention Not Indicated   Readmission Risk Interventions     No data to display

## 2022-05-17 NOTE — Progress Notes (Signed)
Patient hasn't voided since 1030 am today since admission. Did a bladder scan at 1745 and it showed 415 cc in bladder. Notified DO.

## 2022-05-17 NOTE — Plan of Care (Signed)
  Problem: Acute Rehab PT Goals(only PT should resolve) Goal: Pt Will Go Supine/Side To Sit Outcome: Progressing Flowsheets (Taken 05/17/2022 1451) Pt will go Supine/Side to Sit:  with min guard assist  with minimal assist Goal: Patient Will Transfer Sit To/From Stand Outcome: Progressing Flowsheets (Taken 05/17/2022 1451) Patient will transfer sit to/from stand:  with min guard assist  with minimal assist Goal: Pt Will Transfer Bed To Chair/Chair To Bed Outcome: Progressing Flowsheets (Taken 05/17/2022 1451) Pt will Transfer Bed to Chair/Chair to Bed: with min assist Goal: Pt Will Ambulate Outcome: Progressing Flowsheets (Taken 05/17/2022 1451) Pt will Ambulate:  25 feet  with minimal assist  with moderate assist  with rolling walker   2:52 PM, 05/17/22 Lonell Grandchild, MPT Physical Therapist with [facility] [phone] office [phone] mobile phone

## 2022-05-17 NOTE — Evaluation (Signed)
Physical Therapy Evaluation Patient Details Name: Mariah Stevenson MRN: [id number] DOB: 18-Aug-1945 Today's Date: 05/17/2022  History of Present Illness  Mariah Stevenson is a 77 y.o. female with medical history significant for depression/anxiety, gait disorder with tremors, dyslipidemia, memory deficits, and GERD who presented to the ED with worsening left hip/groin pain since she had a fall yesterday while walking her dog.  This was a mechanical fall and she denies any loss of consciousness or head injury.  She follows with neurology and takes her usual home medications as prescribed.   Clinical Impression  Patient demonstrates fair/good return for propping up on elbows to hands during bed mobility, had difficulty completing sit to stands and transferring to chair due to left hip pain, slightly lethargic possibly due to receiving pain medication prior to therapy and limited to a few steps at bedside before having to sit due to fatigue, poor standing balance and c/o increasing left hip pain.  Patient tolerated sitting up in chair after therapy - nursing staff notified.  Patient will benefit from continued skilled physical therapy in hospital and recommended venue below to increase strength, balance, endurance for safe ADLs and gait.         Recommendations for follow up therapy are one component of a multi-disciplinary discharge planning process, led by the attending physician.  Recommendations may be updated based on patient status, additional functional criteria and insurance authorization.  Follow Up Recommendations Skilled nursing-short term rehab (<3 hours/day) Can patient physically be transported by private vehicle: Yes    Assistance Recommended at Discharge    Patient can return home with the following  A lot of help with bathing/dressing/bathroom;A lot of help with walking and/or transfers;Help with stairs or ramp for entrance;Assistance with cooking/housework    Equipment  Recommendations None recommended by PT  Recommendations for Other Services       Functional Status Assessment Patient has had a recent decline in their functional status and demonstrates the ability to make significant improvements in function in a reasonable and predictable amount of time.     Precautions / Restrictions Precautions Precautions: Fall Restrictions Weight Bearing Restrictions: No      Mobility  Bed Mobility Overal bed mobility: Needs Assistance Bed Mobility: Supine to Sit     Supine to sit: Min assist     General bed mobility comments: increased time, labored movement with fair/good return for propping up on elbows to hands    Transfers Overall transfer level: Needs assistance Equipment used: Rolling walker (2 wheels) Transfers: Sit to/from Stand, Bed to chair/wheelchair/BSC Sit to Stand: Min assist, Mod assist   Step pivot transfers: Min assist, Mod assist       General transfer comment: slow unsteady labored movement with c/o severe pain left hip when weightbearing    Ambulation/Gait Ambulation/Gait assistance: Mod assist Gait Distance (Feet): 5 Feet Assistive device: Rolling walker (2 wheels) Gait Pattern/deviations: Decreased step length - right, Decreased step length - left, Decreased stance time - left, Decreased stride length, Antalgic Gait velocity: slow     General Gait Details: limited to a few slow labored unsteady steps at bedside before having to sit due to fatigue and increasing left hip pain  Stairs            Wheelchair Mobility    Modified Rankin (Stroke Patients Only)       Balance Overall balance assessment: Needs assistance Sitting-balance support: Feet supported, No upper extremity supported Sitting balance-Leahy Scale: Fair Sitting balance - Comments:  fair/good seated at EOB   Standing balance support: Reliant on assistive device for balance, During functional activity, Bilateral upper extremity  supported Standing balance-Leahy Scale: Poor Standing balance comment: fair/poor using RW                             Pertinent Vitals/Pain Pain Assessment Pain Assessment: 0-10 Pain Score: 7  Pain Location: left hip Pain Descriptors / Indicators: Sharp, Sore, Discomfort, Grimacing Pain Intervention(s): Limited activity within patient's tolerance, Monitored during session, Premedicated before session, Repositioned    Home Living Family/patient expects to be discharged to:: Private residence Living Arrangements: Children Available Help at Discharge: Family;Available 24 hours/day Type of Home: House Home Access: Stairs to enter Entrance Stairs-Rails: None Entrance Stairs-Number of Steps: 3-4   Home Layout: One level Home Equipment: Conservation officer, nature (2 wheels);Cane - single point;Shower seat      Prior Function Prior Level of Function : Independent/Modified Independent             Mobility Comments: household and short distanced community ambulator using SPC PRN ADLs Comments: Independent     Hand Dominance        Extremity/Trunk Assessment   Upper Extremity Assessment Upper Extremity Assessment: Generalized weakness    Lower Extremity Assessment Lower Extremity Assessment: Generalized weakness;LLE deficits/detail LLE Deficits / Details: grossly 3+/5 LLE: Unable to fully assess due to pain LLE Sensation: WNL LLE Coordination: WNL    Cervical / Trunk Assessment Cervical / Trunk Assessment: Normal  Communication   Communication: No difficulties  Cognition Arousal/Alertness: Awake/alert Behavior During Therapy: WFL for tasks assessed/performed Overall Cognitive Status: Within Functional Limits for tasks assessed                                          General Comments      Exercises     Assessment/Plan    PT Assessment Patient needs continued PT services  PT Problem List Decreased strength;Decreased activity  tolerance;Decreased balance;Decreased mobility       PT Treatment Interventions DME instruction;Gait training;Stair training;Functional mobility training;Therapeutic activities;Therapeutic exercise;Patient/family education;Balance training    PT Goals (Current goals can be found in the Care Plan section)  Acute Rehab PT Goals Patient Stated Goal: return home after rehab PT Goal Formulation: With patient Time For Goal Achievement: 05/31/22 Potential to Achieve Goals: Good    Frequency Min 3X/week     Co-evaluation               AM-PAC PT "6 Clicks" Mobility  Outcome Measure Help needed turning from your back to your side while in a flat bed without using bedrails?: A Little Help needed moving from lying on your back to sitting on the side of a flat bed without using bedrails?: A Little Help needed moving to and from a bed to a chair (including a wheelchair)?: A Lot Help needed standing up from a chair using your arms (e.g., wheelchair or bedside chair)?: A Little Help needed to walk in hospital room?: A Lot Help needed climbing 3-5 steps with a railing? : A Lot 6 Click Score: 15    End of Session   Activity Tolerance: Patient tolerated treatment well;Patient limited by fatigue Patient left: in chair;with call bell/phone within reach;with chair alarm set Nurse Communication: Mobility status PT Visit Diagnosis: Unsteadiness on feet (R26.81);Other abnormalities of gait and  mobility (R26.89);Muscle weakness (generalized) (M62.81)    Time: [id number] PT Time Calculation (min) (ACUTE ONLY): 29 min   Charges:   PT Evaluation $PT Eval Moderate Complexity: 1 Mod PT Treatments $Therapeutic Activity: 23-37 mins        2:50 PM, 05/17/22 Lonell Grandchild, MPT Physical Therapist with [facility] 336 [phone] office [phone] mobile phone

## 2022-05-17 NOTE — ED Notes (Signed)
Ambulation attempted using walker. Pt able to stand and slowly shuffle approximately 2 feet. She was in apparent pain. She reported 8-10 pain. She was unable to bear weight on the left leg.

## 2022-05-17 NOTE — ED Notes (Signed)
Report given to Brittany RN

## 2022-05-17 NOTE — Progress Notes (Addendum)
In and Out cath done. 557m of clear yellow urine collected. Pt. Tolerated well. DO notified.

## 2022-05-17 NOTE — ED Provider Notes (Signed)
[facility]  Provider Note  CSN: [id number] Arrival date & time: 05/17/22 0602  History Chief Complaint  Patient presents with   Mariah Stevenson is a 77 y.o. female reports she fell yesterday afternoon in her yard while walking her dog. She was unable to get up off the ground but eventually her son came and helped her into the house and into bed. She has had L hip/groin pain since then, worse with movement and unable to bear weight. She denies any head injury or LOC. Not on a blood thinner. She has otherwise been in her usual state of health recently.    Home Medications Prior to Admission medications   Medication Sig Start Date End Date Taking? Authorizing Provider  aspirin 81 MG tablet Take 81 mg by mouth daily.    [provider]  atorvastatin (LIPITOR) 20 MG tablet Take 1 tablet by mouth daily. 07/18/16   [provider]  BIOTIN PO Take 1 capsule by mouth daily.    [provider]  buPROPion (WELLBUTRIN XL) 150 MG 24 hr tablet Take 3 tablets po daily    [provider]  Cholecalciferol (VITAMIN D3 PO) Take 2,000 Units by mouth daily.    [provider]  clonazePAM (KLONOPIN) 2 MG tablet Take 1 tablet (2 mg total) by mouth 2 (two) times daily. 02/22/19   Kathrynn Ducking, MD  clopidogrel (PLAVIX) 75 MG tablet Take 75 mg by mouth daily.    [provider]  donepezil (ARICEPT) 10 MG tablet Take 1 tablet (10 mg total) by mouth at bedtime. 09/08/19   Suzzanne Cloud, NP  famotidine (PEPCID) 20 MG tablet One at bedtime 01/13/17   Tanda Rockers, MD  FLUoxetine (PROZAC) 20 MG capsule Take 60 mg by mouth daily.  07/30/16   [provider]  fluticasone (FLONASE) 50 MCG/ACT nasal spray Place 2 sprays into both nostrils daily. 12/12/16   [provider]  Multiple Vitamin (MULTIVITAMIN) capsule Take 1 capsule by mouth daily.    [provider]  pantoprazole  (PROTONIX) 40 MG tablet Take by mouth. 08/09/19   [provider]     Allergies    Elemental sulfur, Penicillins, Gabapentin, Lithium, Prednisone, and Primidone   Review of Systems   Review of Systems Please see HPI for pertinent positives and negatives  Physical Exam BP 117/84   Pulse (!) 59   Temp 98.4 F (36.9 C) (Oral)   Resp 18   Ht 5' 6"$  (1.676 m)   Wt 81.9 kg   SpO2 95%   BMI 29.14 kg/m   Physical Exam Vitals and nursing note reviewed.  Constitutional:      Appearance: Normal appearance.  HENT:     Head: Normocephalic and atraumatic.     Nose: Nose normal.     Mouth/Throat:     Mouth: Mucous membranes are moist.  Eyes:     Extraocular Movements: Extraocular movements intact.     Conjunctiva/sclera: Conjunctivae normal.  Cardiovascular:     Rate and Rhythm: Normal rate.  Pulmonary:     Effort: Pulmonary effort is normal.     Breath sounds: Normal breath sounds.  Abdominal:     General: Abdomen is flat.     Palpations: Abdomen is soft.     Tenderness: There is no abdominal tenderness.  Musculoskeletal:        General: Tenderness (L hip) present. No swelling or deformity.  Normal range of motion.     Cervical back: Neck supple.  Skin:    General: Skin is warm and dry.  Neurological:     General: No focal deficit present.     Mental Status: She is alert.  Psychiatric:        Mood and Affect: Mood normal.     ED Results / Procedures / Treatments   EKG None  Procedures Procedures  Medications Ordered in the ED Medications  HYDROcodone-acetaminophen (NORCO/VICODIN) 5-325 MG per tablet 1 tablet (1 tablet Oral Given 05/17/22 0649)    Initial Impression and Plan  Patient here with L hip pain after fall. She is able to ROM the leg with some discomfort but no deformity. Suspect a pelvic fracture. Will check xray, APAP for pain.   ED Course   Clinical Course as of 05/17/22 0707  Fri May 17, 2022  0643 I personally viewed the images from  radiology studies and agree with radiologist interpretation: xray shows fracture of pelvis, hip is intact. Will give a dose of pain medications and reassess for ability to weight bear.   [CS]  [place] of the patient will be signed out to Dr. Matilde Sprang at the change of shift.  [CS]    Clinical Course User Index [CS] Truddie Hidden, MD     MDM Rules/Calculators/A&P Medical Decision Making Problems Addressed: Closed fracture of ramus of left pubis, initial encounter [facility]): acute illness or injury  Amount and/or Complexity of Data Reviewed Radiology: ordered and independent interpretation performed. Decision-making details documented in ED Course.  Risk Prescription drug management.     Final Clinical Impression(s) / ED Diagnoses Final diagnoses:  Closed fracture of ramus of left pubis, initial encounter [facility])    Rx / [place] Orders ED Discharge Orders     None        Truddie Hidden, MD 05/17/22 [id number]

## 2022-05-17 NOTE — H&P (Signed)
History and Physical    Mariah Stevenson [id number] DOB: May 01, 1945 DOA: 05/17/2022  PCP: Curly Rim, MD   Patient coming from: Home  Chief Complaint: Fall with hip/groin pain  HPI: Mariah Stevenson is a 77 y.o. female with medical history significant for depression/anxiety, gait disorder with tremors, dyslipidemia, memory deficits, and GERD who presented to the ED with worsening left hip/groin pain since she had a fall yesterday while walking her dog.  This was a mechanical fall and she denies any loss of consciousness or head injury.  She follows with neurology and takes her usual home medications as prescribed.   ED Course: Vital signs stable and lab work within normal limits.  Pelvic x-ray demonstrating nondisplaced left-sided pubic rami fracture.  Weightbearing was attempted, but patient has significant pain and requires admission for PT evaluation and pain control.  Review of Systems: Reviewed as noted above, otherwise negative.  Past Medical History:  Diagnosis Date   Altered awareness, transient    Anxiety    Depression    Headache(784.0)    Memory deficits 12/10/2012   Seasonal allergies    Sinus complaint     Past Surgical History:  Procedure Laterality Date   TUBAL LIGATION       reports that she quit smoking about 29 years ago. Her smoking use included cigarettes. She has a 30.00 pack-year smoking history. She has never used smokeless tobacco. She reports current alcohol use. She reports that she does not use drugs.  Allergies  Allergen Reactions   Elemental Sulfur Other (See Comments)    seizures   Penicillins Rash    thrush in throat   Gabapentin     Could not remember   Lithium     tremors   Prednisone     psychosis   Primidone     Hallucinations    Family History  Problem Relation Age of Onset   Stroke Father    Emphysema Mother        smoked   Asthma Grandchild    Rectal cancer Son    Liver cancer Son     Prior to  Admission medications   Medication Sig Start Date End Date Taking? Authorizing Provider  aspirin 81 MG tablet Take 81 mg by mouth daily.    [provider]  atorvastatin (LIPITOR) 20 MG tablet Take 1 tablet by mouth daily. 07/18/16   [provider]  BIOTIN PO Take 1 capsule by mouth daily.    [provider]  buPROPion (WELLBUTRIN XL) 150 MG 24 hr tablet Take 3 tablets po daily    [provider]  Cholecalciferol (VITAMIN D3 PO) Take 2,000 Units by mouth daily.    [provider]  clonazePAM (KLONOPIN) 2 MG tablet Take 1 tablet (2 mg total) by mouth 2 (two) times daily. 02/22/19   Kathrynn Ducking, MD  clopidogrel (PLAVIX) 75 MG tablet Take 75 mg by mouth daily.    [provider]  donepezil (ARICEPT) 10 MG tablet Take 1 tablet (10 mg total) by mouth at bedtime. 09/08/19   Suzzanne Cloud, NP  famotidine (PEPCID) 20 MG tablet One at bedtime 01/13/17   Tanda Rockers, MD  FLUoxetine (PROZAC) 20 MG capsule Take 60 mg by mouth daily.  07/30/16   [provider]  fluticasone (FLONASE) 50 MCG/ACT nasal spray Place 2 sprays into both nostrils daily. 12/12/16   [provider]  Multiple Vitamin (MULTIVITAMIN) capsule Take 1 capsule by mouth daily.  [provider]  pantoprazole (PROTONIX) 40 MG tablet Take by mouth. 08/09/19   [provider]    Physical Exam: Vitals:   05/17/22 0607 05/17/22 0652 05/17/22 0700 05/17/22 0730  BP: 119/63 117/84 127/68 128/79  Pulse: 63 (!) 59 (!) 58 (!) 58  Resp: 16 18    Temp: 98.4 F (36.9 C)     TempSrc: Oral     SpO2: 94% 95% 95% 92%  Weight:      Height:        Constitutional: NAD, calm, comfortable Vitals:   05/17/22 0607 05/17/22 0652 05/17/22 0700 05/17/22 0730  BP: 119/63 117/84 127/68 128/79  Pulse: 63 (!) 59 (!) 58 (!) 58  Resp: 16 18    Temp: 98.4 F (36.9 C)     TempSrc: Oral     SpO2: 94% 95% 95% 92%  Weight:      Height:       Eyes: lids and  conjunctivae normal Neck: normal, supple Respiratory: clear to auscultation bilaterally. Normal respiratory effort. No accessory muscle use.  Cardiovascular: Regular rate and rhythm, no murmurs. Abdomen: no tenderness, no distention. Bowel sounds positive.  Musculoskeletal:  No edema. Skin: no rashes, lesions, ulcers.  Psychiatric: Flat affect  Labs on Admission: I have personally reviewed following labs and imaging studies  CBC: Recent Labs  Lab 05/17/22 0815  WBC 10.3  NEUTROABS 7.8*  HGB 13.2  HCT 39.5  MCV 94.0  PLT 99991111   Basic Metabolic Panel: Recent Labs  Lab 05/17/22 0815  NA 136  K 3.7  CL 100  CO2 27  GLUCOSE 123*  BUN 14  CREATININE 0.84  CALCIUM 9.2   GFR: Estimated Creatinine Clearance: 61.4 mL/min (by C-G formula based on SCr of 0.84 mg/dL). Liver Function Tests: Recent Labs  Lab 05/17/22 0815  AST 36  ALT 23  ALKPHOS 78  BILITOT 0.9  PROT 6.9  ALBUMIN 3.8   No results for input(s): "LIPASE", "AMYLASE" in the last 168 hours. No results for input(s): "AMMONIA" in the last 168 hours. Coagulation Profile: No results for input(s): "INR", "PROTIME" in the last 168 hours. Cardiac Enzymes: No results for input(s): "CKTOTAL", "CKMB", "CKMBINDEX", "TROPONINI" in the last 168 hours. BNP (last 3 results) No results for input(s): "PROBNP" in the last 8760 hours. HbA1C: No results for input(s): "HGBA1C" in the last 72 hours. CBG: No results for input(s): "GLUCAP" in the last 168 hours. Lipid Profile: No results for input(s): "CHOL", "HDL", "LDLCALC", "TRIG", "CHOLHDL", "LDLDIRECT" in the last 72 hours. Thyroid Function Tests: Recent Labs    05/17/22 0815  TSH 1.602   Anemia Panel: No results for input(s): "VITAMINB12", "FOLATE", "FERRITIN", "TIBC", "IRON", "RETICCTPCT" in the last 72 hours. Urine analysis: No results found for: "COLORURINE", "APPEARANCEUR", "LABSPEC", "PHURINE", "GLUCOSEU", "HGBUR", "BILIRUBINUR", "KETONESUR", "PROTEINUR",  "UROBILINOGEN", "NITRITE", "LEUKOCYTESUR"  Radiological Exams on Admission: DG Hip Unilat With Pelvis 2-3 Views Left  Result Date: 05/17/2022 CLINICAL DATA:  Fall with left groin and hip pain EXAM: DG HIP (WITH OR WITHOUT PELVIS) 3V LEFT COMPARISON:  None Available. FINDINGS: Nondisplaced inferior pubic ramus fracture on the left. The left pubic body is also fractured without displacement. Generalized osteopenia. Located sacroiliac joints and symphysis pubis. No evidence of hip fracture. IMPRESSION: Nondisplaced fractures of the left inferior pubic ramus and left pubic body. Electronically Signed   By: Jorje Guild M.D.   On: 05/17/2022 06:40     Assessment/Plan Principal Problem:   Bilateral pubic rami fractures (HCC) Active Problems:  Memory deficits   Gait disorder   Essential tremor    Mechanical fall with bilateral pubic rami fractures -In the setting of gait disorder/tremors, being followed by neurology -PT evaluation pending -Fall precautions -Pain management -Outpatient referral to orthopedics  Depression/anxiety -Continue home medications as prior  Memory deficits -Continue Aricept and other home medications -Follows with neurology outpatient  Dyslipidemia -Continue statin  GERD -PPI  DVT prophylaxis: Lovenox Code Status: Full Family Communication: None at bedside Disposition Plan:Admit for PT evaluation/pain management Consults called:None Admission status: Obs, Med/Surg  Severity of Illness: The appropriate patient status for this patient is OBSERVATION. Observation status is judged to be reasonable and necessary in order to provide the required intensity of service to ensure the patient's safety. The patient's presenting symptoms, physical exam findings, and initial radiographic and laboratory data in the context of their medical condition is felt to place them at decreased risk for further clinical deterioration. Furthermore, it is anticipated that the  patient will be medically stable for discharge from the hospital within 2 midnights of admission.    Kimiko Common D Manuella Ghazi DO [facility]  If 7PM-7AM, please contact night-coverage [email or web address]  05/17/2022, 9:35 AM

## 2022-05-17 NOTE — ED Provider Notes (Signed)
  Physical Exam  BP 117/84   Pulse (!) 59   Temp 98.4 F (36.9 C) (Oral)   Resp 18   Ht 5' 6"$  (1.676 m)   Wt 81.9 kg   SpO2 95%   BMI 29.14 kg/m   Physical Exam Vitals and nursing note reviewed.  Constitutional:      General: She is not in acute distress.    Appearance: She is well-developed.  HENT:     Head: Normocephalic and atraumatic.  Eyes:     Conjunctiva/sclera: Conjunctivae normal.  Cardiovascular:     Rate and Rhythm: Normal rate and regular rhythm.     Heart sounds: No murmur heard. Pulmonary:     Effort: Pulmonary effort is normal. No respiratory distress.     Breath sounds: Normal breath sounds.  Abdominal:     Palpations: Abdomen is soft.     Tenderness: There is no abdominal tenderness.  Musculoskeletal:        General: Tenderness present. No swelling.     Cervical back: Neck supple.  Skin:    General: Skin is warm and dry.     Capillary Refill: Capillary refill takes less than 2 seconds.  Neurological:     Mental Status: She is alert.  Psychiatric:        Mood and Affect: Mood normal.     Procedures  Procedures  ED Course / MDM   Clinical Course as of 05/17/22 0746  Fri May 17, 2022  0643 I personally viewed the images from radiology studies and agree with radiologist interpretation: xray shows fracture of pelvis, hip is intact. Will give a dose of pain medications and reassess for ability to weight bear.   [CS]  [place] of the patient will be signed out to Dr. Matilde Sprang at the change of shift.  [CS]    Clinical Course User Index [CS] Truddie Hidden, MD   Medical Decision Making Amount and/or Complexity of Data Reviewed Labs: ordered. Radiology: ordered.  Risk Prescription drug management.   Patient received an handoff.  Fall with pubic rami fractures.  Plan at time of signout was for pain control and ambulation trial.  Unfortunately, patient barely able to ambulate with pain control and does not have significant resources at home.   Patient is a fall risk with new fractures and require hospitalization for PT and pain control.  Patient admitted.       Teressa Lower, MD 05/17/22 [phone]

## 2022-05-17 NOTE — Care Management Obs Status (Signed)
MEDICARE OBSERVATION STATUS NOTIFICATION   Patient Details  Name: Mariah Stevenson MRN: [id number] Date of Birth: July 23, 1945   Medicare Observation Status Notification Given:  Yes    Tommy Medal 05/17/2022, 3:10 PM

## 2022-05-17 NOTE — ED Triage Notes (Signed)
Pt c/o mechanical fall yesterday afternoon at about 1530. Pt states she hasn't been able to put weight on her left leg since the fall. Pt c/o left hip and upper leg pain.

## 2022-05-17 NOTE — Progress Notes (Addendum)
DOB: Jul 30, 2045 Date: 05/17/22   Must ID: [id number]   To Whom it May Concern:  Please be advised that the above named patient will require a short-term nursing home stay- anticipated 30 days or less rehabilitation and strengthening. The plan is for return home.

## 2022-05-17 NOTE — NC FL2 (Signed)
[place] LEVEL OF CARE FORM     IDENTIFICATION  Patient Name: Mariah Stevenson Birthdate: April 03, 1945 Sex: female Admission Date (Current Location): 05/17/2022  [place] [facility] and [place] Number:  [employer] and Address:  [place] [address], [place]      Provider Number: [id number]  Attending Physician Name and Address:  Rodena Goldmann, DO  Relative Name and Phone Number:       Current Level of Care: Hospital Recommended Level of Care: [place] Prior Approval Number:    Date Approved/Denied:   PASRR Number: pending  Discharge Plan: SNF    Current Diagnoses: Patient Active Problem List   Diagnosis Date Noted   Bilateral pubic rami fractures ([place]) 05/17/2022   Gait disorder 01/25/2019   Essential tremor 01/25/2019   Obesity (BMI 30-39.9) 07/17/2016   Cough variant asthma vs UACS  10/05/2015   Dyspnea 10/05/2015   Pulmonary infiltrates c/w mild / transient BOOP 10/05/2015   Memory deficits 12/10/2012   Headache(784.0) 08/05/2012   Altered awareness, transient 08/05/2012    Orientation RESPIRATION BLADDER Height & Weight     Self, Time, Situation, Place  Normal Continent Weight: 180 lb 8.9 oz (81.9 kg) Height:  5' 6"$  (167.6 cm)  BEHAVIORAL SYMPTOMS/MOOD NEUROLOGICAL BOWEL NUTRITION STATUS      Continent Diet (Heart healthy. See d/c summary for updates.)  AMBULATORY STATUS COMMUNICATION OF NEEDS Skin   Extensive Assist Verbally Normal                       Personal Care Assistance Level of Assistance  Bathing, Feeding, Dressing Bathing Assistance: Maximum assistance Feeding assistance: Limited assistance Dressing Assistance: Maximum assistance     Functional Limitations Info  Sight, Hearing, Speech Sight Info: Adequate Hearing Info: Adequate Speech Info: Adequate    SPECIAL CARE FACTORS FREQUENCY  PT (By licensed PT)     PT Frequency: 5x weekly               Contractures      Additional Factors Info  Code Status, Allergies, Psychotropic Code Status Info: Full code Allergies Info: Elemental Sulfur, Penicillins, Gabapentin, Lithium, Prednisone, Primidone Psychotropic Info: Wellbutrin, Klonopin, Prozac         Current Medications (05/17/2022):  This is the current hospital active medication list Current Facility-Administered Medications  Medication Dose Route Frequency Provider Last Rate Last Admin   0.9 %  sodium chloride infusion  250 mL Intravenous PRN Manuella Ghazi, Pratik D, DO       acetaminophen (TYLENOL) tablet 650 mg  650 mg Oral Q6H PRN Manuella Ghazi, Pratik D, DO       Or   acetaminophen (TYLENOL) suppository 650 mg  650 mg Rectal Q6H PRN Manuella Ghazi, Pratik D, DO       [START ON 05/18/2022] aspirin EC tablet 81 mg  81 mg Oral Daily Manuella Ghazi, Pratik D, DO       [START ON 05/18/2022] atorvastatin (LIPITOR) tablet 20 mg  20 mg Oral Daily Manuella Ghazi, Pratik D, DO       [START ON 05/18/2022] buPROPion (WELLBUTRIN XL) 24 hr tablet 450 mg  450 mg Oral Daily Manuella Ghazi, Pratik D, DO       clonazePAM (KLONOPIN) tablet 2 mg  2 mg Oral BID Manuella Ghazi, Pratik D, DO       [START ON 05/18/2022] clopidogrel (PLAVIX) tablet 75 mg  75 mg Oral Daily Manuella Ghazi, Pratik D, DO  donepezil (ARICEPT) tablet 10 mg  10 mg Oral QHS Manuella Ghazi, Pratik D, DO       enoxaparin (LOVENOX) injection 40 mg  40 mg Subcutaneous Daily Manuella Ghazi, Pratik D, DO   40 mg at 05/17/22 1012   [START ON 05/18/2022] FLUoxetine (PROZAC) capsule 60 mg  60 mg Oral Daily Manuella Ghazi, Pratik D, DO       fluticasone (FLONASE) 50 MCG/ACT nasal spray 2 spray  2 spray Each Nare Daily PRN Manuella Ghazi, Pratik D, DO       HYDROmorphone (DILAUDID) injection 0.5-1 mg  0.5-1 mg Intravenous Q2H PRN Manuella Ghazi, Pratik D, DO   1 mg at 05/17/22 1128   [START ON 05/18/2022] multivitamin with minerals tablet 1 tablet  1 tablet Oral Daily Manuella Ghazi, Pratik D, DO       ondansetron (ZOFRAN) tablet 4 mg  4 mg Oral Q6H PRN Manuella Ghazi, Pratik D, DO       Or   ondansetron (ZOFRAN) injection 4  mg  4 mg Intravenous Q6H PRN Manuella Ghazi, Pratik D, DO       oxyCODONE (Oxy IR/ROXICODONE) immediate release tablet 5 mg  5 mg Oral Q4H PRN Manuella Ghazi, Pratik D, DO       [START ON 05/18/2022] pantoprazole (PROTONIX) EC tablet 40 mg  40 mg Oral Daily Shah, Pratik D, DO       propranolol (INDERAL) tablet 10 mg  10 mg Oral BID Manuella Ghazi, Pratik D, DO       [START ON 05/18/2022] senna (SENOKOT) tablet 8.6 mg  1 tablet Oral Daily Manuella Ghazi, Pratik D, DO       sodium chloride flush (NS) 0.9 % injection 3 mL  3 mL Intravenous Q12H Shah, Pratik D, DO   3 mL at 05/17/22 0959   sodium chloride flush (NS) 0.9 % injection 3 mL  3 mL Intravenous PRN Manuella Ghazi, Pratik D, DO       SUMAtriptan (IMITREX) tablet 100 mg  100 mg Oral Q2H PRN Heath Lark D, DO         Discharge Medications: Please see discharge summary for a list of discharge medications.  Relevant Imaging Results:  Relevant Lab Results:   Additional Information SSN: [id number]  Salome Arnt, LCSW

## 2022-05-18 DIAGNOSIS — S32592D Other specified fracture of left pubis, subsequent encounter for fracture with routine healing: Secondary | ICD-10-CM | POA: Diagnosis not present

## 2022-05-18 DIAGNOSIS — R413 Other amnesia: Secondary | ICD-10-CM

## 2022-05-18 DIAGNOSIS — R269 Unspecified abnormalities of gait and mobility: Secondary | ICD-10-CM

## 2022-05-18 DIAGNOSIS — S32591D Other specified fracture of right pubis, subsequent encounter for fracture with routine healing: Secondary | ICD-10-CM

## 2022-05-18 LAB — BASIC METABOLIC PANEL
Anion gap: 6 (ref 5–15)
BUN: 16 mg/dL (ref 8–23)
CO2: 28 mmol/L (ref 22–32)
Calcium: 8.6 mg/dL — ABNORMAL LOW (ref 8.9–10.3)
Chloride: 101 mmol/L (ref 98–111)
Creatinine, Ser: 0.68 mg/dL (ref 0.44–1.00)
GFR, Estimated: >60 mL/min (ref >60)
Glucose, Bld: 133 mg/dL — ABNORMAL HIGH (ref 70–99)
Potassium: 4.2 mmol/L (ref 3.5–5.1)
Sodium: 135 mmol/L (ref 135–145)

## 2022-05-18 LAB — CBC
HCT: 35.8 % — ABNORMAL LOW (ref 36.0–46.0)
Hemoglobin: 12 g/dL (ref 12.0–15.0)
MCH: 31.9 pg (ref 26.0–34.0)
MCHC: 33.5 g/dL (ref 30.0–36.0)
MCV: 95.2 fL (ref 80.0–100.0)
Platelets: 193 10*3/uL (ref 150–400)
RBC: 3.76 MIL/uL — ABNORMAL LOW (ref 3.87–5.11)
RDW: 12.6 % (ref 11.5–15.5)
WBC: 8.3 10*3/uL (ref 4.0–10.5)
nRBC: 0 % (ref 0.0–0.2)

## 2022-05-18 LAB — MAGNESIUM: Magnesium: 1.9 mg/dL (ref 1.7–2.4)

## 2022-05-18 MED ORDER — SUMATRIPTAN SUCCINATE 50 MG PO TABS: 100.0000 mg | ORAL_TABLET | ORAL | Status: DC | PRN

## 2022-05-18 NOTE — Progress Notes (Signed)
Pt. Has not urinated since in and out cath at 9pm 05/17/21. Bladder scan showed 164m. Assisted pt. To BSC. Upon further assessment there was a yellow wet area on bed pad. [facility]

## 2022-05-18 NOTE — Plan of Care (Signed)

## 2022-05-18 NOTE — Progress Notes (Signed)
Pt up OOB to chair using FWW and standby assist x1. Pt states left leg harder to move than right but pt was able to take small steps to get into chair and tolerated well. Chair alarm on for safety, call bell within reach. Pt set up for breakfast.

## 2022-05-18 NOTE — Progress Notes (Signed)
  Progress Note   Patient: Mariah Stevenson [id number] DOB: [birth year] DOA: 05/17/2022     0 DOS: the patient was seen and examined on 05/18/2022   Brief hospital course: As per H&P written by Dr. Manuella Ghazi on 05/17/22 OLLA SERVISS is a 77 y.o. female with medical history significant for depression/anxiety, gait disorder with tremors, dyslipidemia, memory deficits, and GERD who presented to the ED with worsening left hip/groin pain since she had a fall yesterday while walking her dog.  This was a mechanical fall and she denies any loss of consciousness or head injury.  She follows with neurology and takes her usual home medications as prescribed.   ED Course: Vital signs stable and lab work within normal limits.  Pelvic x-ray demonstrating nondisplaced left-sided pubic rami fracture.  Weightbearing was attempted, but patient has significant pain and requires admission for PT evaluation and pain control.  Assessment and Plan: 1-mechanical fall with bilateral pubic rami fracture -Physical therapy has evaluated patient with recommendation to skilled nursing facility for further care, rehabilitation and strengthening. -Continue to provide analgesic therapy and follow fall  Precautions. -TOC aware and helping with placement.  2-history of gait disorder/tremors -Continue outpatient follow-up with neurology service.  3-history of memory deficits -Appears to be stable and at baseline -Continue Aricept and other home medication -Continue supportive care and assistance. -Continue patient follow-up with neurology.  4-gastroesophageal flux disease -Continue PPI.  5-dyslipidemia -Continue statin.   Subjective:  Afebrile, no chest pain, no nausea or vomiting.  Reporting some improvement with the use of analgesics but is still having limitation while moving her legs.  Physical Exam: Vitals:   05/17/22 2129 05/18/22 0513 05/18/22 1033 05/18/22 1232  BP: 118/64 136/71 99/64 122/69   Pulse: 72 70 (!) 58 (!) 59  Resp: 18 17  18  $ Temp: 98 F (36.7 C) 97.6 F (36.4 C)  (!) 97.5 F (36.4 C)  TempSrc: Oral Oral  Oral  SpO2: 98% 91%  94%  Weight:      Height:       General exam: Alert, awake and following commands appropriately.  No fever, no chest pain. Respiratory system: Clear to auscultation. Respiratory effort normal.  Good saturation on room air. Cardiovascular system:RRR. No rubs or gallops; no JVD. Gastrointestinal system: Abdomen is nondistended, soft and nontender. No organomegaly or masses felt. Normal bowel sounds heard. Central nervous system: No focal neurological deficits. Extremities: No cyanosis or clubbing. Skin: No. Psychiatry: Mood & affect appropriate.    Data Reviewed: Magnesium: 1.9 Basic metabolic panel: Sodium A999333, potassium 4.2, chloride 101, bicarb 28, BUN 16, creatinine 0.68 CBC: White blood cells 9.3, hemoglobin 12.0 and platelet count 193 K  Family Communication: None at bedside.  Disposition: Status is: Observation The patient will require care spanning > 2 midnights and should be moved to inpatient because: Continue analgesic management and find nursing facility for placement.   Planned Discharge Destination: Skilled nursing facility  Time spent: 35 minutes  Author: Barton Dubois, MD 05/18/2022 4:39 PM  For on call review [email or web address].

## 2022-05-19 DIAGNOSIS — S32592D Other specified fracture of left pubis, subsequent encounter for fracture with routine healing: Secondary | ICD-10-CM | POA: Diagnosis not present

## 2022-05-19 DIAGNOSIS — S32591D Other specified fracture of right pubis, subsequent encounter for fracture with routine healing: Secondary | ICD-10-CM | POA: Diagnosis not present

## 2022-05-19 DIAGNOSIS — R269 Unspecified abnormalities of gait and mobility: Secondary | ICD-10-CM | POA: Diagnosis not present

## 2022-05-19 DIAGNOSIS — R413 Other amnesia: Secondary | ICD-10-CM | POA: Diagnosis not present

## 2022-05-19 LAB — URINALYSIS, ROUTINE W REFLEX MICROSCOPIC
Bilirubin Urine: NEGATIVE
Glucose, UA: NEGATIVE mg/dL
Ketones, ur: NEGATIVE mg/dL
Nitrite: NEGATIVE
Protein, ur: 100 mg/dL — AB
Specific Gravity, Urine: 1.012 (ref 1.005–1.030)
WBC, UA: >50 WBC/hpf (ref 0–5)
pH: 6 (ref 5.0–8.0)

## 2022-05-19 NOTE — Progress Notes (Signed)
  Progress Note   Patient: Mariah Stevenson [id number] DOB: April 15, 1945 DOA: 05/17/2022     0 DOS: the patient was seen and examined on 05/19/2022   Brief hospital course: As per H&P written by Dr. Manuella Ghazi on 05/17/22 Mariah Stevenson is a 77 y.o. female with medical history significant for depression/anxiety, gait disorder with tremors, dyslipidemia, memory deficits, and GERD who presented to the ED with worsening left hip/groin pain since she had a fall yesterday while walking her dog.  This was a mechanical fall and she denies any loss of consciousness or head injury.  She follows with neurology and takes her usual home medications as prescribed.   ED Course: Vital signs stable and lab work within normal limits.  Pelvic x-ray demonstrating nondisplaced left-sided pubic rami fracture.  Weightbearing was attempted, but patient has significant pain and requires admission for PT evaluation and pain control.  Assessment and Plan: 1-mechanical fall with bilateral pubic rami fracture -Physical therapy has evaluated patient with recommendation to skilled nursing facility for further care, rehabilitation and strengthening. -Continue to provide analgesic therapy and follow fall  Precautions. -TOC aware and helping with placement.  2-history of gait disorder/tremors -Continue outpatient follow-up with neurology service.  3-history of memory deficits -Appears to be stable and at baseline -Continue Aricept and other home medication -Continue supportive care and assistance. -Continue patient follow-up with neurology.  4-gastroesophageal flux disease -Continue PPI.  5-dyslipidemia -Continue statin.  6-hematuria -Change blood appreciated in patient's urine -Patient chronically on aspirin and Plavix; was using Lovenox for DVT prophylaxis. -Holding Lovenox at this moment.  SCDs for DVT prophylaxis will be provided -Will check urinalysis to rule out the presence of UTI; patient is currently  denying any significant dysuria.   Subjective:  No fever, no chest pain, no nausea or vomiting.  Reports feeling deconditioned and having pain in her pelvics bilaterally when trying to bear weight.  Current analgesic regimen controlling discomfort.  Physical Exam: Vitals:   05/18/22 2023 05/19/22 0518 05/19/22 1030 05/19/22 1345  BP: 113/70 109/61 107/60 107/71  Pulse: (!) 59 64 68 61  Resp: 18 18 18 18  $ Temp: 97.6 F (36.4 C) 98.4 F (36.9 C)  98.3 F (36.8 C)  TempSrc: Oral Oral  Oral  SpO2: 98% 100% 97% 96%  Weight:      Height:       General exam: Alert, awake, following commands and in no major distress.  Reports still having bilateral pelvic pain. Respiratory system: Clear to auscultation. Respiratory effort normal. Cardiovascular system:RRR. No rubs or gallops. Gastrointestinal system: Abdomen is nondistended, soft and nontender. No organomegaly or masses felt. Normal bowel sounds heard. Central nervous system: No focal neurological deficits. Extremities/skin: No cyanosis or clubbing.  No petechiae and no edema appreciated. Psychiatry: Judgement and insight appear normal. Mood & affect appropriate.    Latest data Reviewed: Magnesium: 1.9 Basic metabolic panel: Sodium A999333, potassium 4.2, chloride 101, bicarb 28, BUN 16, creatinine 0.68 CBC: White blood cells 9.3, hemoglobin 12.0 and platelet count 193 K Urinalysis with microscopy: Pending  Family Communication: None at bedside.  Disposition: Status is: Observation The patient will require care spanning > 2 midnights and should be moved to inpatient because: Continue analgesic management and find nursing facility for placement.   Planned Discharge Destination: Skilled nursing facility  Time spent: 35 minutes  Author: Barton Dubois, MD 05/19/2022 5:47 PM  For on call review [email or web address].

## 2022-05-19 NOTE — Progress Notes (Signed)
During final nursing of the shift, NT called writer to room to assess pt peri-area. Blood tinged urine and blood noted on Purewick; suction noted at 75 meters--removed Purewick, and pt c/o of abd and burning pain specifically while voiding.ABD soft and tender. Reported to oncoming RN to update MD during rounds. SRP,RN

## 2022-05-20 DIAGNOSIS — R413 Other amnesia: Secondary | ICD-10-CM | POA: Diagnosis not present

## 2022-05-20 DIAGNOSIS — G250 Essential tremor: Secondary | ICD-10-CM | POA: Diagnosis not present

## 2022-05-20 DIAGNOSIS — S32591D Other specified fracture of right pubis, subsequent encounter for fracture with routine healing: Secondary | ICD-10-CM | POA: Diagnosis not present

## 2022-05-20 DIAGNOSIS — R269 Unspecified abnormalities of gait and mobility: Secondary | ICD-10-CM | POA: Diagnosis not present

## 2022-05-20 DIAGNOSIS — N3001 Acute cystitis with hematuria: Secondary | ICD-10-CM

## 2022-05-20 LAB — CBC
HCT: 36.9 % (ref 36.0–46.0)
Hemoglobin: 12 g/dL (ref 12.0–15.0)
MCH: 31.5 pg (ref 26.0–34.0)
MCHC: 32.5 g/dL (ref 30.0–36.0)
MCV: 96.9 fL (ref 80.0–100.0)
Platelets: 209 10*3/uL (ref 150–400)
RBC: 3.81 MIL/uL — ABNORMAL LOW (ref 3.87–5.11)
RDW: 12.7 % (ref 11.5–15.5)
WBC: 12.9 10*3/uL — ABNORMAL HIGH (ref 4.0–10.5)
nRBC: 0 % (ref 0.0–0.2)

## 2022-05-20 LAB — BASIC METABOLIC PANEL
Anion gap: 8 (ref 5–15)
BUN: 16 mg/dL (ref 8–23)
CO2: 27 mmol/L (ref 22–32)
Calcium: 8.6 mg/dL — ABNORMAL LOW (ref 8.9–10.3)
Chloride: 100 mmol/L (ref 98–111)
Creatinine, Ser: 0.83 mg/dL (ref 0.44–1.00)
GFR, Estimated: >60 mL/min (ref >60)
Glucose, Bld: 98 mg/dL (ref 70–99)
Potassium: 3.9 mmol/L (ref 3.5–5.1)
Sodium: 135 mmol/L (ref 135–145)

## 2022-05-20 MED ORDER — OXYCODONE HCL 5 MG PO TABS: 5.0000 mg | ORAL_TABLET | ORAL | 0 refills | Status: AC | PRN

## 2022-05-20 MED ORDER — CEFDINIR 300 MG PO CAPS: 300.0000 mg | ORAL_CAPSULE | Freq: Two times a day (BID) | ORAL | 0 refills | Status: AC

## 2022-05-20 MED ORDER — SODIUM CHLORIDE 0.9 % IV SOLN
1.0000 g | INTRAVENOUS | Status: DC
  Administered 2022-05-20: 1 g via INTRAVENOUS
  Filled 2022-05-20: qty 10

## 2022-05-20 MED ORDER — CLONAZEPAM 2 MG PO TABS: 1.0000 mg | ORAL_TABLET | Freq: Two times a day (BID) | ORAL | 0 refills | Status: AC | PRN

## 2022-05-20 NOTE — Progress Notes (Signed)
IV removed, patients discharge paper work and prescriptions placed in packet for receiving facility, patient transported via EMS

## 2022-05-20 NOTE — TOC Transition Note (Addendum)
Transition of Care [facility]) - CM/SW Discharge Note   Patient Details  Name: Mariah Stevenson MRN: [id number] Date of Birth: 1946-02-21  Transition of [facility]) CM/SW Contact:  Boneta Lucks, RN Phone Number: 05/20/2022, 12:44 PM   Clinical Narrative:   Insurance approved. 2/17 - 2/20, next review 2/20, navis auth id [id number]. Patient agreeing to [facility] in [place], [place].  TOC could not reach family. Patient state their phones are out. CM called Tim on the chart, he will updated the family.  RN to call report. Medical necessity printed. TOC will call EMS when RN is ready.  CM spoke with Wendi in admission, she is updating everyone at the facility to have her room ready.   Addendum Marland Kitchen   PASSR#  [id number]   Final next level of care: [place] Barriers to Discharge: Barriers Resolved   Patient Goals and CMS Choice   Choice offered to / list presented to : Patient  Discharge Placement                 Patient to be transferred to facility by: EMS Name of family member notified: Tim Patient and family notified of of transfer: 05/20/22  Discharge Plan and Services Additional resources added to the After Visit Summary for   In-house Referral: Clinical Social Work   Post Acute Care Choice: [place]              Social Determinants of Health ([place]) Interventions SDOH Screenings   Food Insecurity: No Food Insecurity (05/17/2022)  Housing: Low Risk  (05/17/2022)  Transportation Needs: No Transportation Needs (05/17/2022)  Utilities: Not At Risk (05/17/2022)  Tobacco Use: Medium Risk (05/17/2022)

## 2022-05-20 NOTE — Discharge Summary (Signed)
Physician Discharge Summary   Patient: Mariah Stevenson MRN: [id number] DOB: 1945/06/09  Admit date:     05/17/2022  Discharge date: 05/20/22  Discharge Physician: Barton Dubois   PCP: Curly Rim, MD   Recommendations at discharge:  Repeat CBC to follow hemoglobin trend/stability Repeat basic metabolic panel to follow electrolytes renal function.   Discharge Diagnoses: Principal Problem:   Bilateral pubic rami fractures (HCC) Active Problems:   Memory deficits   Gait disorder   Essential tremor   Acute cystitis with hematuria  Hospital Course: As per H&P written by Dr. Manuella Ghazi on 05/17/22 Mariah Stevenson is a 77 y.o. female with medical history significant for depression/anxiety, gait disorder with tremors, dyslipidemia, memory deficits, and GERD who presented to the ED with worsening left hip/groin pain since she had a fall yesterday while walking her dog.  This was a mechanical fall and she denies any loss of consciousness or head injury.  She follows with neurology and takes her usual home medications as prescribed.   ED Course: Vital signs stable and lab work within normal limits.  Pelvic x-ray demonstrating nondisplaced left-sided pubic rami fracture.  Weightbearing was attempted, but patient has significant pain and requires admission for PT evaluation and pain control.  Assessment and Plan: 1-mechanical fall with bilateral pubic rami fracture -Physical therapy has evaluated patient with recommendation to skilled nursing facility for further care, rehabilitation and strengthening. -Continue to provide analgesic therapy and follow fall  Precautions. -TOC aware and helping with placement.   2-history of gait disorder/tremors -Continue outpatient follow-up with neurology service.   3-history of memory deficits -Appears to be stable and at baseline -Continue Aricept and other home medication -Continue supportive care and assistance. -Continue patient follow-up  with neurology.   4-gastroesophageal flux disease -Continue PPI and the use of Pepcid.   5-dyslipidemia -Continue statin. -Heart healthy diet discussed with patient.   6-UTI with hematuria -tinged blood appreciated in patient's urine -Patient chronically on aspirin and Plavix; was using Lovenox for DVT prophylaxis. -Urinalysis suggesting UTI; patient expressed some mild suprapubic pain. -Empirical treatment with cefdinir x 5 days will be provided. -Advised to maintain adequate hydration. -No fever, WBC within normal limits.  Consultants: None; orthopedic service curbside at time of admission. Procedures performed: See below for x-ray reports. Disposition: Skilled nursing facility Diet recommendation: Heart healthy diet.  DISCHARGE MEDICATION: Allergies as of 05/20/2022       Reactions   Elemental Sulfur Other (See Comments)   seizures   Penicillins Rash   thrush in throat   Gabapentin    Could not remember   Lithium    tremors   Prednisone    psychosis   Primidone    Hallucinations        Medication List     STOP taking these medications    DAYQUIL PO       TAKE these medications    aspirin 81 MG tablet Take 81 mg by mouth daily.   atorvastatin 20 MG tablet Commonly known as: LIPITOR Take 1 tablet by mouth daily.   BIOTIN PO Take 1 capsule by mouth daily.   buPROPion 150 MG 24 hr tablet Commonly known as: WELLBUTRIN XL Take 150 mg by mouth daily. Take 3 tablets po daily   cefdinir 300 MG capsule Commonly known as: OMNICEF Take 1 capsule (300 mg total) by mouth 2 (two) times daily for 5 days.   clonazePAM 2 MG tablet Commonly known as: KLONOPIN Take 0.5 tablets (1 mg  total) by mouth every 12 (twelve) hours as needed for anxiety. What changed:  how much to take when to take this reasons to take this   clopidogrel 75 MG tablet Commonly known as: PLAVIX Take 75 mg by mouth daily.   donepezil 10 MG tablet Commonly known as: ARICEPT Take  1 tablet (10 mg total) by mouth at bedtime.   famotidine 20 MG tablet Commonly known as: Pepcid One at bedtime   FLUoxetine 20 MG capsule Commonly known as: PROZAC Take 60 mg by mouth daily.   fluticasone 50 MCG/ACT nasal spray Commonly known as: FLONASE Place 2 sprays into both nostrils daily.   multivitamin capsule Take 1 capsule by mouth daily.   oxyCODONE 5 MG immediate release tablet Commonly known as: Oxy IR/ROXICODONE Take 1 tablet (5 mg total) by mouth every 4 (four) hours as needed for moderate pain.   pantoprazole 40 MG tablet Commonly known as: PROTONIX Take by mouth.   propranolol 10 MG tablet Commonly known as: INDERAL Take 20 mg by mouth in the morning.   senna 8.6 MG tablet Commonly known as: SENOKOT Take 1 tablet by mouth daily.   SUMAtriptan 100 MG tablet Commonly known as: IMITREX Take 50 mg by mouth every 2 (two) hours as needed for migraine.   VITAMIN D3 PO Take 2,000 Units by mouth daily.        Contact information for follow-up providers     Corrington, Kip A, MD. Schedule an appointment as soon as possible for a visit in 2 week(s).   Specialty: Family Medicine Why: After discharge from SNF Contact information: [place] [address] [place] [address] [phone]              Contact information for after-discharge care     [facility] .   Service: Skilled Nursing Contact information: [place] [phone]                    Discharge Exam: Danley Danker Weights   05/17/22 0606  Weight: 81.9 kg   General exam: Alert, awake, following commands and in no major distress.  Reports still having bilateral pelvic pain and mild burning discomfort with urination. Respiratory system: Clear to auscultation. Respiratory effort normal. Cardiovascular system:RRR. No rubs or gallops. Gastrointestinal system: Abdomen is nondistended, soft and  nontender. No organomegaly or masses felt. Normal bowel sounds heard. Central nervous system: No focal neurological deficits. Extremities/skin: No cyanosis or clubbing.  No petechiae and no edema appreciated. Psychiatry: Judgement and insight appear normal. Mood & affect appropriate.   Condition at discharge: Stable and improved.  The results of significant diagnostics from this hospitalization (including imaging, microbiology, ancillary and laboratory) are listed below for reference.   Imaging Studies: DG Hip Unilat With Pelvis 2-3 Views Left  Result Date: 05/17/2022 CLINICAL DATA:  Fall with left groin and hip pain EXAM: DG HIP (WITH OR WITHOUT PELVIS) 3V LEFT COMPARISON:  None Available. FINDINGS: Nondisplaced inferior pubic ramus fracture on the left. The left pubic body is also fractured without displacement. Generalized osteopenia. Located sacroiliac joints and symphysis pubis. No evidence of hip fracture. IMPRESSION: Nondisplaced fractures of the left inferior pubic ramus and left pubic body. Electronically Signed   By: Jorje Guild M.D.   On: 05/17/2022 06:40    Microbiology: No results found for this or any previous visit.  Labs: CBC: Recent Labs  Lab 05/17/22  [id number] 05/18/22 0551 05/20/22 0537  WBC 10.3 8.3 12.9*  NEUTROABS 7.8*  --   --   HGB 13.2 12.0 12.0  HCT 39.5 35.8* 36.9  MCV 94.0 95.2 96.9  PLT 206 193 [id number]   Basic Metabolic Panel: Recent Labs  Lab 05/17/22 0815 05/18/22 0551 05/20/22 0537  NA 136 135 135  K 3.7 4.2 3.9  CL 100 101 100  CO2 27 28 27  $ GLUCOSE 123* 133* 98  BUN 14 16 16  $ CREATININE 0.84 0.68 0.83  CALCIUM 9.2 8.6* 8.6*  MG  --  1.9  --    Liver Function Tests: Recent Labs  Lab 05/17/22 0815  AST 36  ALT 23  ALKPHOS 78  BILITOT 0.9  PROT 6.9  ALBUMIN 3.8   CBG: No results for input(s): "GLUCAP" in the last 168 hours.  Discharge time spent: greater than 30 minutes.  Signed: Barton Dubois, MD [facility] 05/20/2022

## 2022-05-20 NOTE — Plan of Care (Signed)
  Problem: Education: Goal: Knowledge of General Education information will improve Description: Including pain rating scale, medication(s)/side effects and non-pharmacologic comfort measures 05/20/2022 0138 by Zadie Rhine, RN Outcome: Progressing 05/20/2022 0133 by Zadie Rhine, RN Outcome: Progressing   Problem: Health Behavior/Discharge Planning: Goal: Ability to manage health-related needs will improve Outcome: Progressing   Problem: Clinical Measurements: Goal: Ability to maintain clinical measurements within normal limits will improve Outcome: Progressing

## 2022-05-20 NOTE — Plan of Care (Signed)
  Problem: Education: Goal: Knowledge of General Education information will improve Description: Including pain rating scale, medication(s)/side effects and non-pharmacologic comfort measures 05/20/2022 0229 by Zadie Rhine, RN Outcome: Progressing 05/20/2022 0138 by Zadie Rhine, RN Outcome: Progressing 05/20/2022 0133 by Zadie Rhine, RN Outcome: Progressing   Problem: Health Behavior/Discharge Planning: Goal: Ability to manage health-related needs will improve 05/20/2022 0229 by Zadie Rhine, RN Outcome: Progressing 05/20/2022 0138 by Zadie Rhine, RN Outcome: Progressing

## 2022-05-20 NOTE — Progress Notes (Signed)
Patient being discharged to [facility] in [place], this nurse gave report to Ander Purpura at [facility], EMS to transport patient, awaiting for EMS arrival.
